# Patient Record
Sex: Male | Born: 1960 | Race: Black or African American | Hispanic: No | Marital: Married | Smoking: Former smoker
Health system: Southern US, Academic
[De-identification: ages and names within clinical notes are randomized; demographics above are authoritative.]

## PROBLEM LIST (undated history)

## (undated) DIAGNOSIS — E119 Type 2 diabetes mellitus without complications: Secondary | ICD-10-CM

## (undated) DIAGNOSIS — Z86010 Personal history of colonic polyps: Secondary | ICD-10-CM

## (undated) DIAGNOSIS — S83207A Unspecified tear of unspecified meniscus, current injury, left knee, initial encounter: Secondary | ICD-10-CM

## (undated) DIAGNOSIS — J449 Chronic obstructive pulmonary disease, unspecified: Secondary | ICD-10-CM

## (undated) DIAGNOSIS — Z9989 Dependence on other enabling machines and devices: Secondary | ICD-10-CM

## (undated) DIAGNOSIS — Z973 Presence of spectacles and contact lenses: Secondary | ICD-10-CM

## (undated) DIAGNOSIS — I10 Essential (primary) hypertension: Secondary | ICD-10-CM

## (undated) DIAGNOSIS — G4730 Sleep apnea, unspecified: Secondary | ICD-10-CM

## (undated) HISTORY — PX: SHOULDER SURGERY: SHX246

## (undated) HISTORY — DX: Type 2 diabetes mellitus without complications (CMS HCC): E11.9

## (undated) HISTORY — PX: HX SHOULDER ARTHROSCOPY: SHX128

## (undated) HISTORY — PX: CATARACT EXTRACTION W/ INTRAOCULAR LENS IMPLANT: SHX1309

## (undated) HISTORY — DX: Sleep apnea, unspecified: G47.30

## (undated) HISTORY — PX: HX CARPAL TUNNEL RELEASE: SHX101

## (undated) HISTORY — PX: COLONOSCOPY: SHX174

## (undated) HISTORY — DX: Essential (primary) hypertension: I10

## (undated) HISTORY — PX: HX HEART CATHETERIZATION: SHX148

---

## 2016-06-11 ENCOUNTER — Ambulatory Visit (HOSPITAL_COMMUNITY): Payer: Self-pay

## 2020-09-04 DIAGNOSIS — I252 Old myocardial infarction: Secondary | ICD-10-CM | POA: Insufficient documentation

## 2020-09-04 DIAGNOSIS — J4489 Other specified chronic obstructive pulmonary disease (CMS HCC): Secondary | ICD-10-CM | POA: Insufficient documentation

## 2020-09-04 DIAGNOSIS — Z720 Tobacco use: Secondary | ICD-10-CM | POA: Insufficient documentation

## 2020-09-04 DIAGNOSIS — G4730 Sleep apnea, unspecified: Secondary | ICD-10-CM | POA: Insufficient documentation

## 2020-09-10 IMAGING — MR MRI ABDOMEN WITHOUT AND WITH CONTRAST
14 series · 48 of 48 positions shown · IV contrast (Gadavist)
Comparison: None available.

﻿EXAM:  MRI ABDOMEN WITHOUT AND WITH CONTRAST
INDICATION: Pancreatic lesion.
TECHNIQUE: Multiplanar multisequential MRI of the abdomen was performed without and with 7 mL of Gadavist. A non-conventional body coil was utilized due to patient's extremely large body habitus.

[Series 7: cor basg bh · coronal · 10.0mm · 1.31mm/px · 2 of 24 slices shown]
[im 1/24]
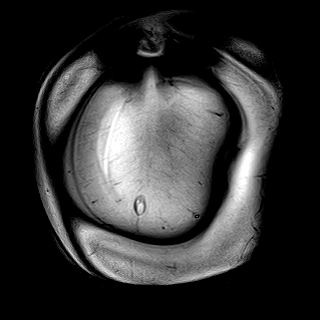
[im 24/24]
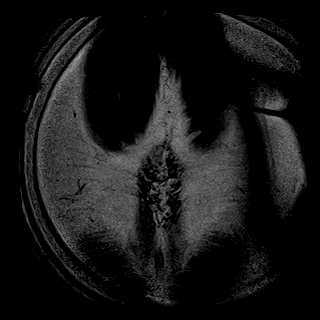

[Series 8: axial in/out phase · axial · 12.0mm · 1.76mm/px · z∈[-117,+182]mm · 2 of 24 slices shown (1 of 2)]
[im 1/24]
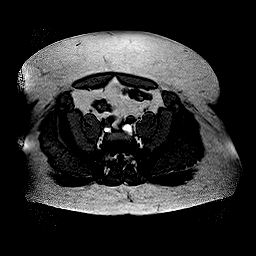
[im 24/24]
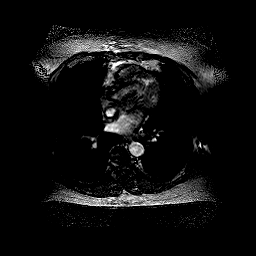

[Series 9: axial in/out phase · axial · 12.0mm · 1.76mm/px · z∈[-117,+182]mm · 2 of 24 slices shown (2 of 2)]
[im 1/24]
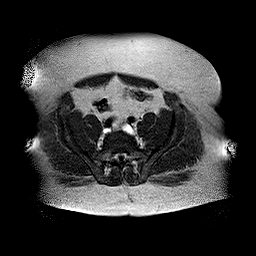
[im 24/24]
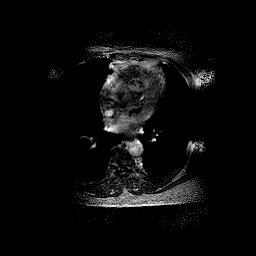

[Series 10: T2 · axial · 12.0mm · 1.76mm/px · z∈[-117,+182]mm · 2 of 24 slices shown (1 of 3)]
[im 1/24]
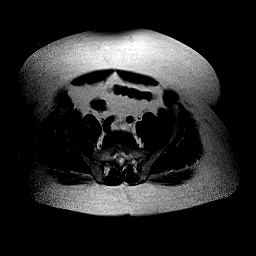
[im 24/24]
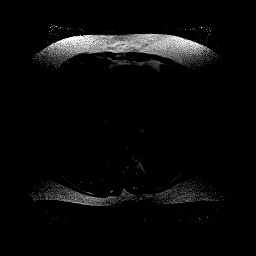

[Series 11: T2 · axial · 12.0mm · 1.76mm/px · z∈[-117,+182]mm · 3 of 24 slices shown (2 of 3)]
[im 1/24]
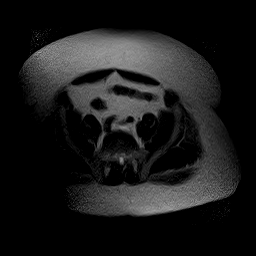
[im 12/24]
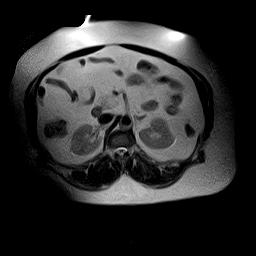
[im 24/24]
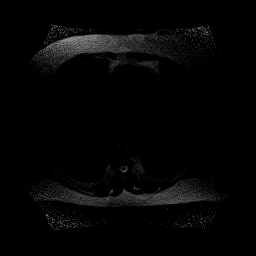

[Series 13: T1 · axial · 12.0mm · 1.76mm/px · z∈[-117,+182]mm · 3 of 24 slices shown (1 of 2)]
[im 1/24]
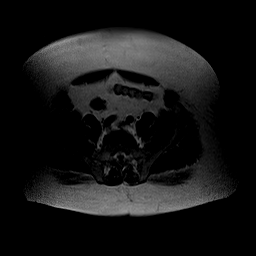
[im 12/24]
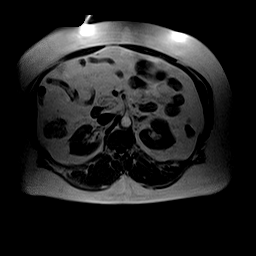
[im 24/24]
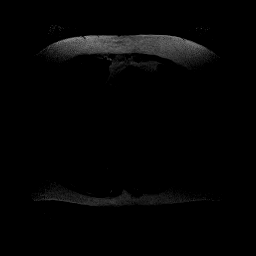

[Series 14: axial basg bh · axial · 12.0mm · 1.41mm/px · z∈[-117,+182]mm · 3 of 24 slices shown]
[im 1/24]
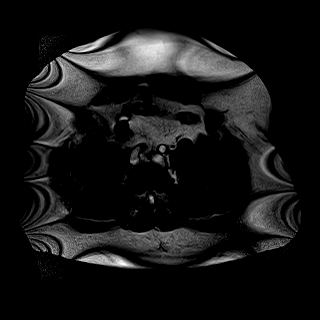
[im 12/24]
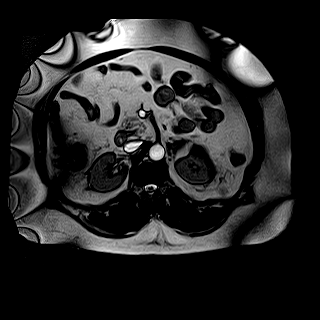
[im 24/24]
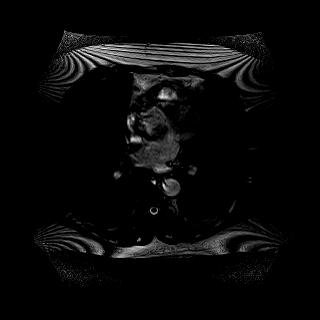

[Series 15: T1 · coronal · 10.0mm · 1.64mm/px · 3 of 24 slices shown (2 of 2)]
[im 1/24]
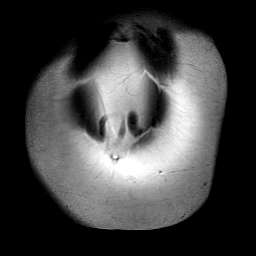
[im 12/24]
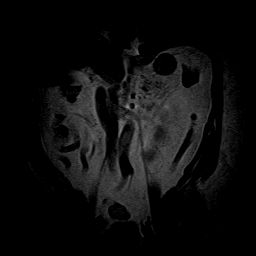
[im 24/24]
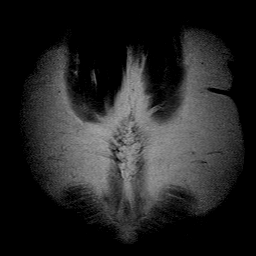

[Series 16: T2 · coronal · 10.0mm · 1.64mm/px · 3 of 24 slices shown (3 of 3)]
[im 1/24]
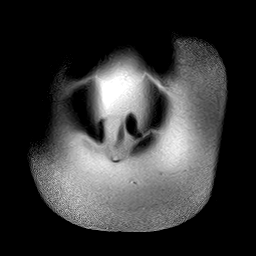
[im 12/24]
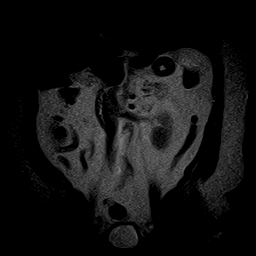
[im 24/24]
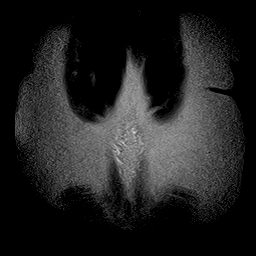

[Series 17: (person_name) pre bh · coronal · non-contrast · 10.0mm · 1.41mm/px · 6 of 52 slices shown]
[im 1/52]
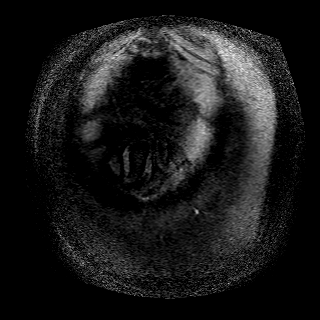
[im 11/52]
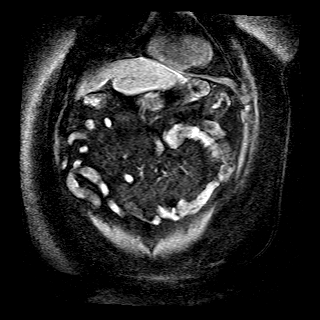
[im 21/52]
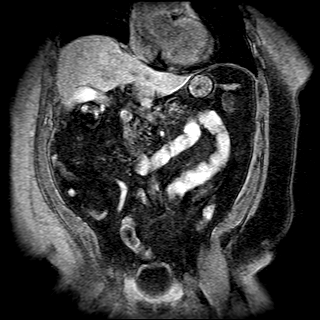
[im 31/52]
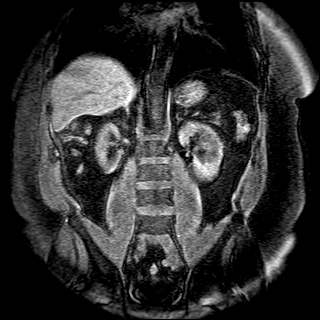
[im 41/52]
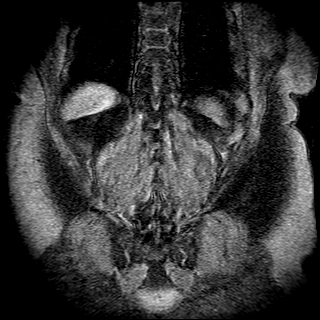
[im 52/52]
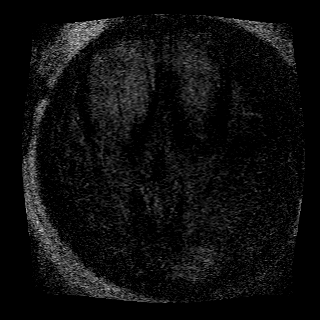

[Series 18: T2 fat-sat · axial · 12.0mm · 2.01mm/px · z∈[-117,+182]mm · 3 of 24 slices shown]
[im 1/24]
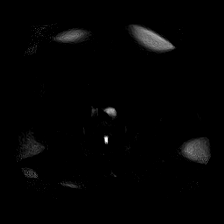
[im 12/24]
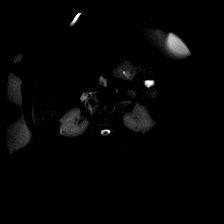
[im 24/24]
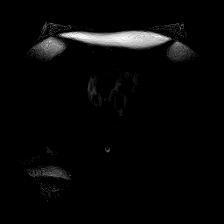

[Series 19: axial (person_name) pre · axial · non-contrast · 10.0mm · 1.41mm/px · z∈[-43,+182]mm · 5 of 46 slices shown]
[im 1/46]
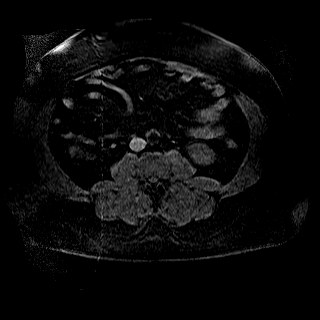
[im 12/46]
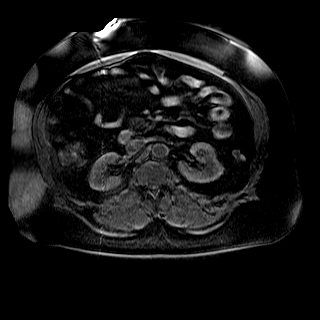
[im 23/46]
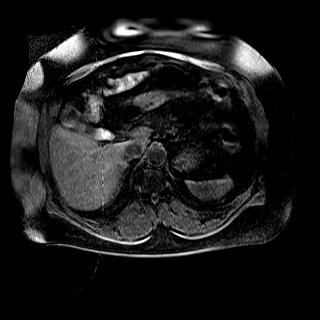
[im 34/46]
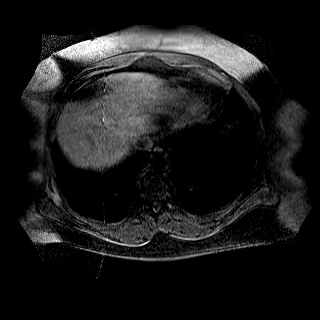
[im 46/46]
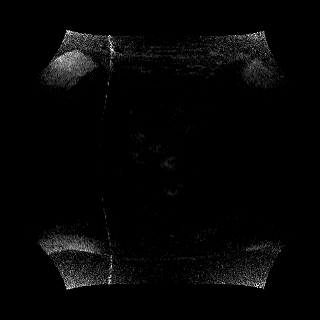

[Series 20: axial (person_name) · axial · 10.0mm · 1.41mm/px · z∈[-43,+182]mm · 5 of 46 slices shown]
[im 1/46]
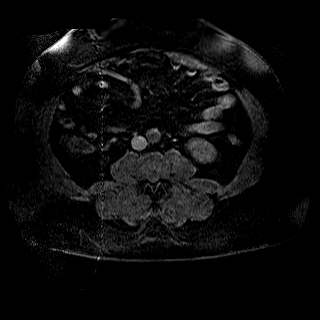
[im 12/46]
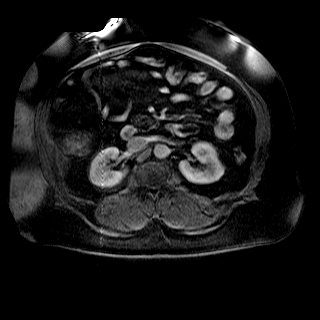
[im 23/46]
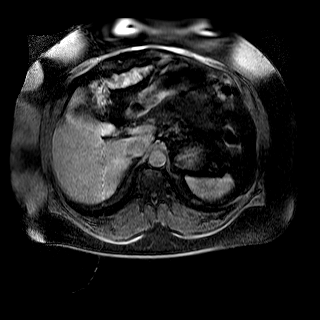
[im 34/46]
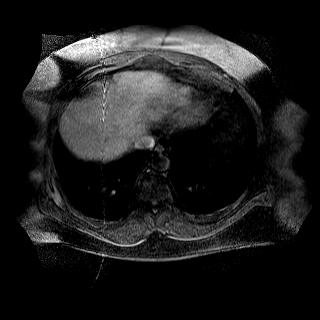
[im 46/46]
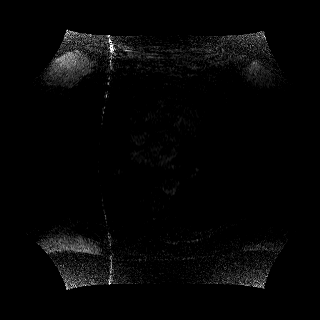

[Series 21: (person_name) · coronal · 10.0mm · 1.41mm/px · 6 of 52 slices shown]
[im 1/52]
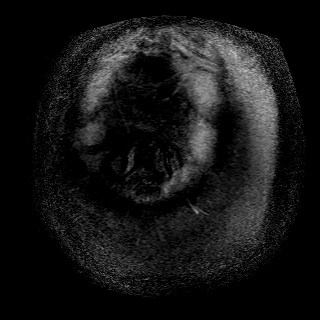
[im 11/52]
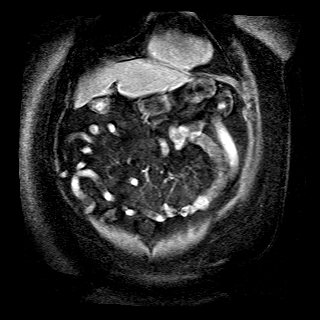
[im 21/52]
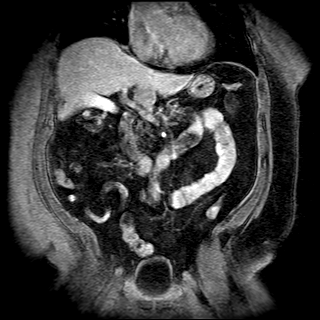
[im 31/52]
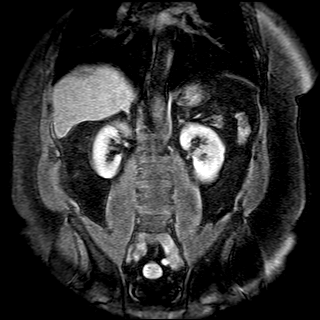
[im 41/52]
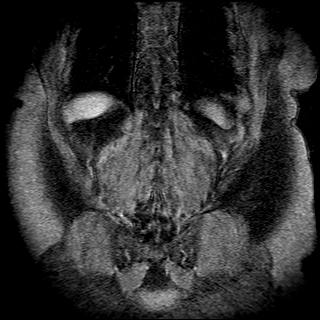
[im 52/52]
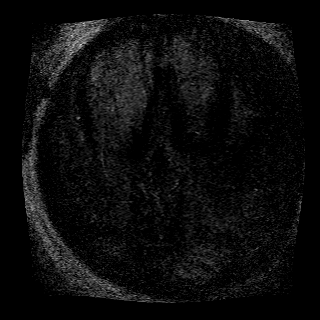

[48 of 48 positions shown; findings below may reference images not displayed]

FINDINGS: There is fatty infiltration of the liver. There is a 10 mm right kidney upper pole cyst. Gallbladder, spleen, pancreas, adrenal glands and left kidney are normal. No suspicious pancreatic mass is identified. There is no abnormal enhancement. No pleural effusion or upper abdominal ascites is seen. There is no definite retroperitoneal adenopathy.
IMPRESSION: 1. No suspicious pancreatic mass. 

2. Fatty liver.

## 2020-10-02 DIAGNOSIS — R9439 Abnormal result of other cardiovascular function study: Secondary | ICD-10-CM | POA: Insufficient documentation

## 2021-01-11 IMAGING — MR MRI JOINT UPPER EXTREMITY WITHOUT CONTRAST LT
7 series · 38 of 40 positions shown · non-contrast
Comparison: No prior radiographic studies of the shoulder available for comparison.

﻿EXAM:  MRI JOINT UPPER EXTREMITY WITHOUT CONTRAST LT SHOULDER
INDICATION: 59-year-old with persistent left shoulder pain for several months with limited range of motion.  No history of shoulder surgery.
TECHNIQUE: Axial, oblique coronal and oblique sagittal images of the left shoulder as per protocol.

[Series 7: T1 · oblique · left · 4.0mm · 0.33mm/px · 5 of 20 slices shown]
[im 1/20]
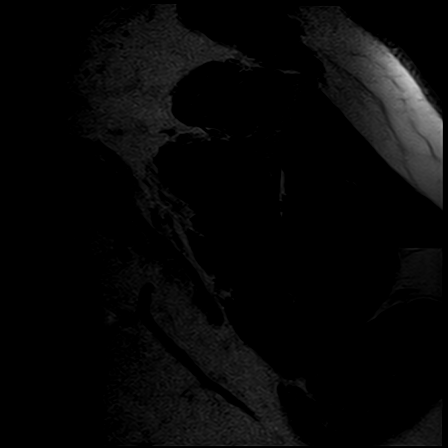
[im 5/20]
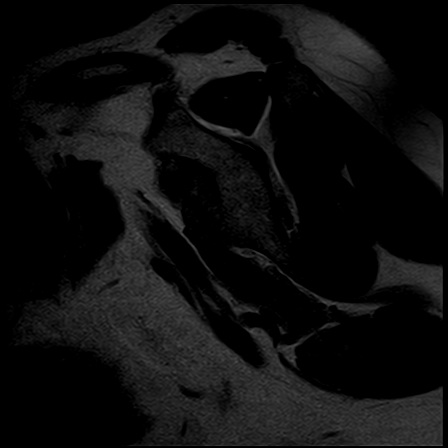
[im 10/20]
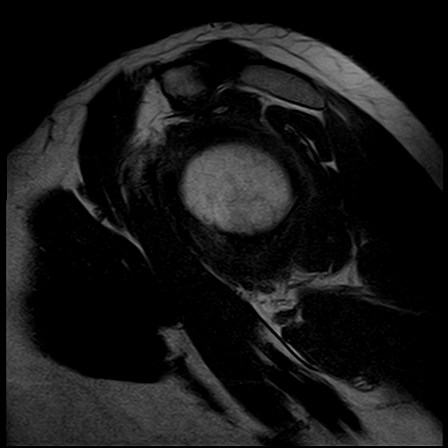
[im 15/20]
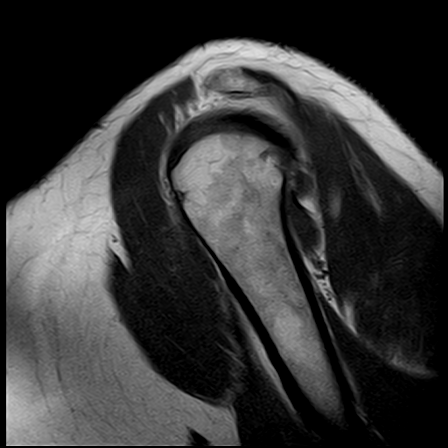
[im 20/20]
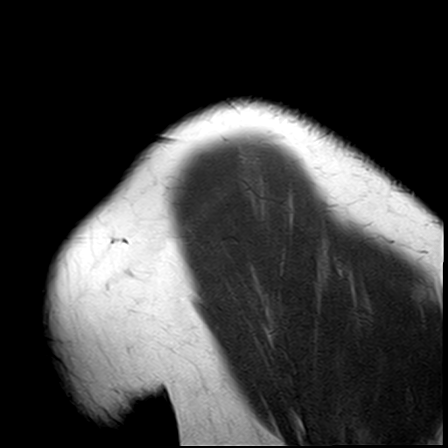

[Series 8: PD fat-sat · axial · left · 4.0mm · 0.50mm/px · z∈[-40,+51]mm · 5 of 22 slices shown (1 of 2)]
[im 1/22]
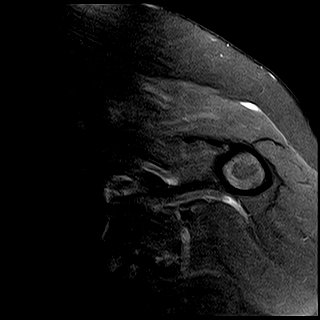
[im 6/22]
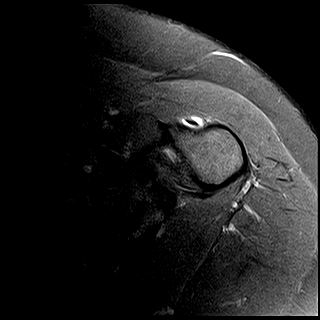
[im 11/22]
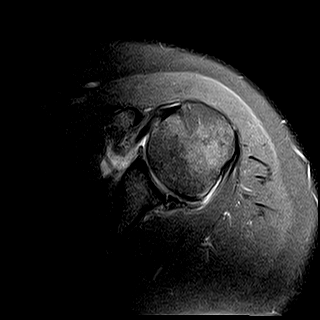
[im 16/22]
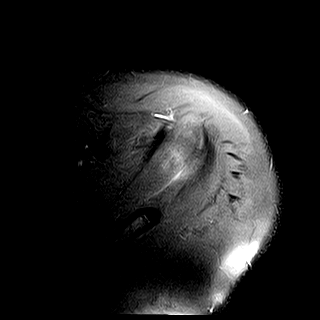
[im 22/22]
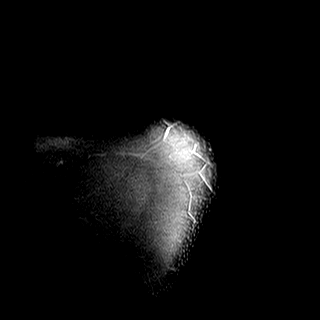

[Series 9: T2 · oblique · left · 4.0mm · 0.39mm/px · 6 of 20 slices shown]
[im 1/20]
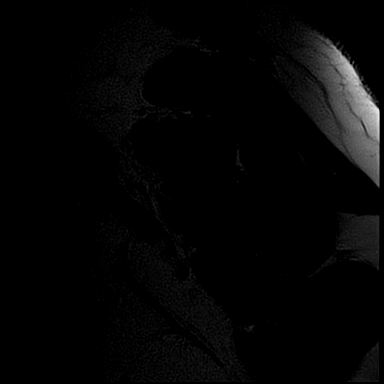
[im 4/20]
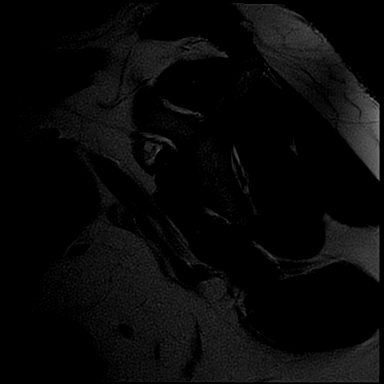
[im 8/20]
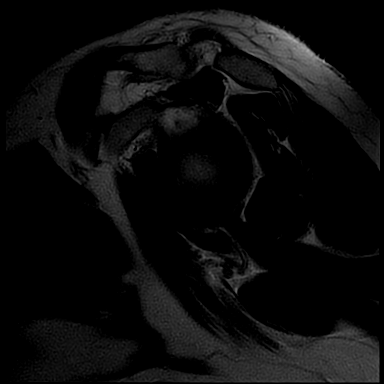
[im 12/20]
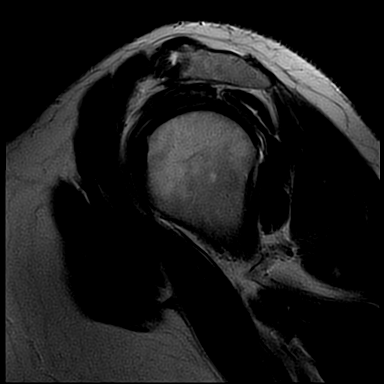
[im 16/20]
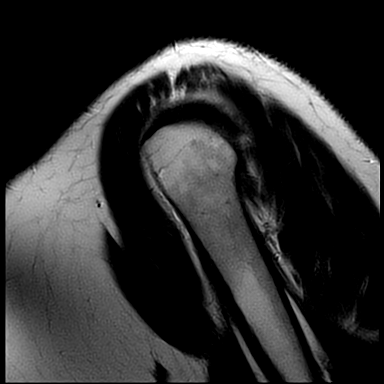
[im 20/20]
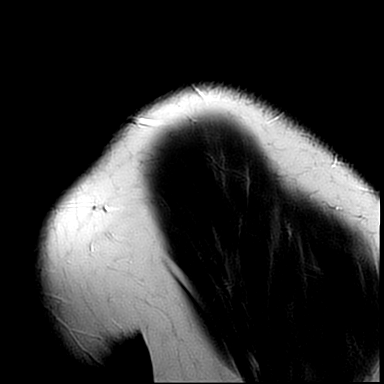

[Series 10: PD fat-sat · oblique · left · 4.0mm · 0.47mm/px · 6 of 20 slices shown (2 of 2)]
[im 1/20]
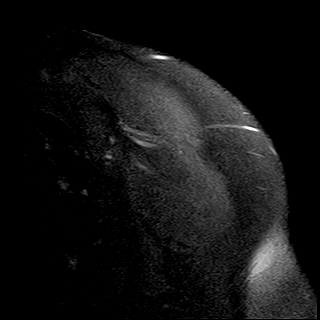
[im 4/20]
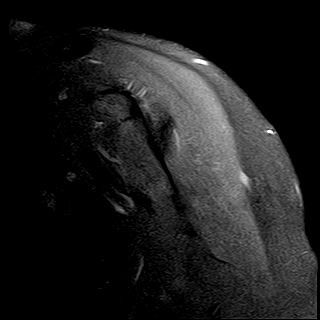
[im 8/20]
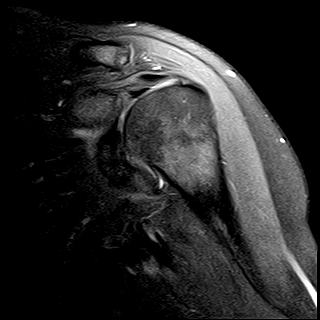
[im 12/20]
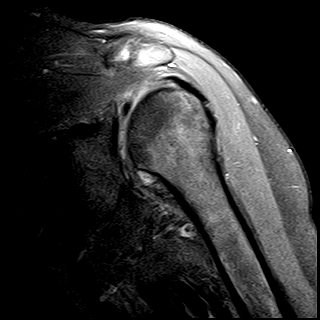
[im 16/20]
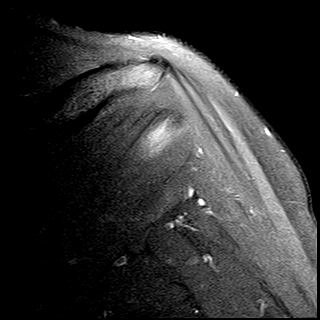
[im 20/20]
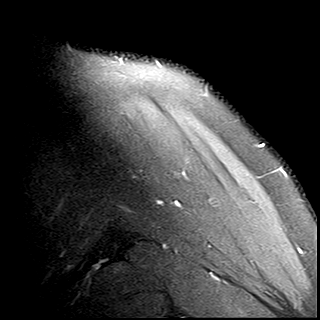

[Series 11: STIR · oblique · left · 4.0mm · 0.47mm/px · 6 of 20 slices shown (1 of 2)]
[im 1/20]
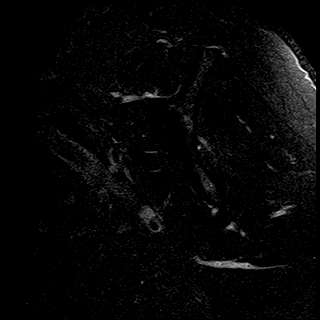
[im 4/20]
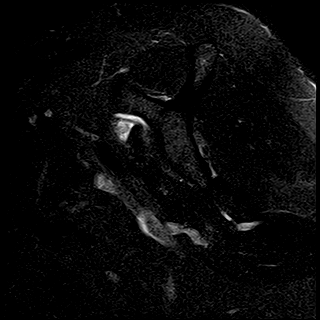
[im 8/20]
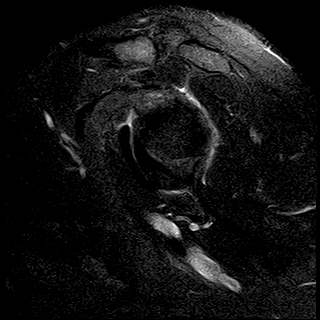
[im 12/20]
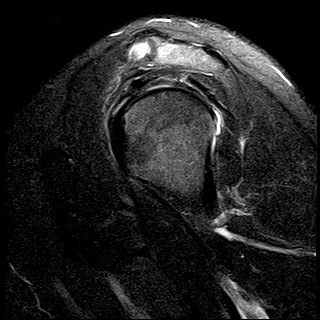
[im 16/20]
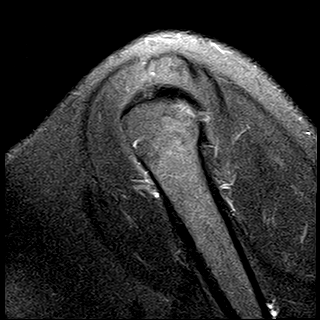
[im 20/20]
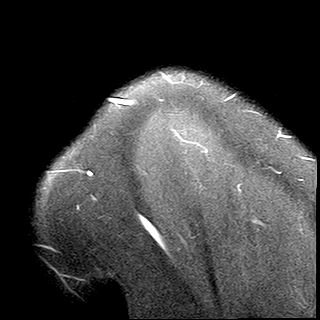

[Series 12: T2 fat-sat · axial · left · 4.0mm · 0.42mm/px · z∈[-40,+51]mm · 6 of 22 slices shown]
[im 1/22]
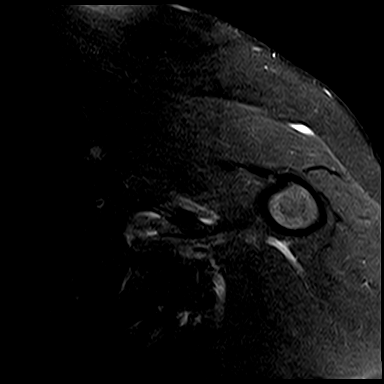
[im 5/22]
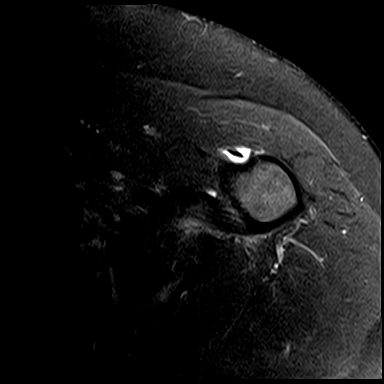
[im 9/22]
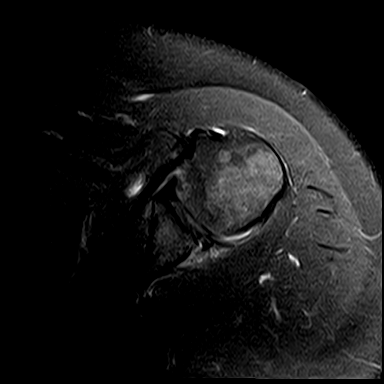
[im 13/22]
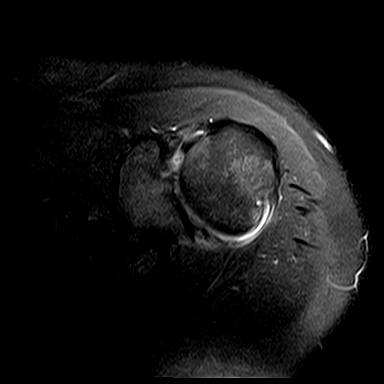
[im 17/22]
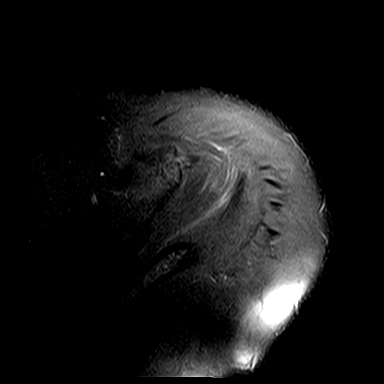
[im 22/22]
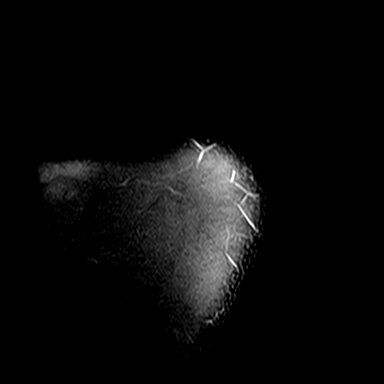

[Series 13: STIR · oblique · left · 4.0mm · 0.47mm/px · 4 of 20 slices shown (2 of 2)]
[im 1/20]
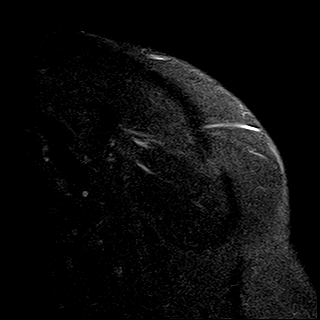
[im 4/20]
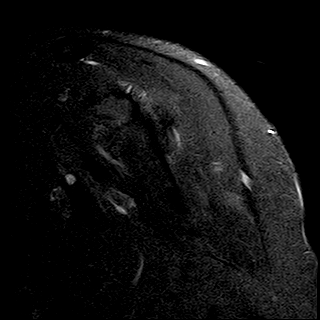
[im 8/20]
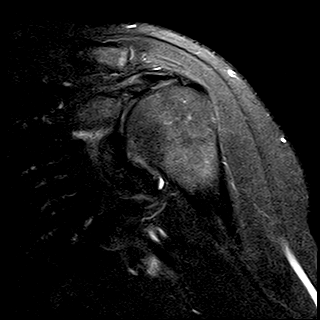
[im 12/20]
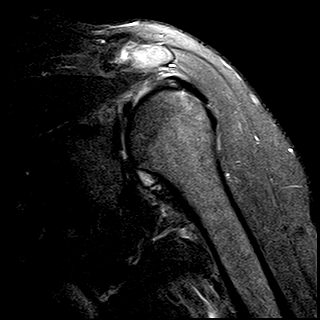

[38 of 40 positions shown; findings below may reference images not displayed]

FINDINGS: No acute bony lesions at the left shoulder.  Significant degenerative changes of AC joint are mildly impinging on subacromion space and the supraspinatus.  Mild supraspinatus tendinitis with small intrasubstance tear within the distal supraspinatus and a tiny partial thickness tear on the bursal side of the supraspinatus in the subacromion space.  No full thickness tear of rotator cuff is seen.  

Mild degenerative changes of the glenohumeral joint and the glenoid labrum are noted.  Small cystic degeneration and nondisplaced tear of the posterior lip of the glenoid labrum is noted.  Small effusion in the subacromion bursa is noted.  Soft tissues of the axilla are normal.
IMPRESSION: 1. No acute bony lesions at the left shoulder.  

2. Degenerative changes of AC joint impinging on the subacromion space and supraspinatus.  Supraspinatus tendinitis and small partial thickness tear as mentioned above of the supraspinatus.  No full thickness tear.  

3. Moderate degenerative changes of glenohumeral joint and glenoid labrum. Nondisplaced tear of the posterior lip of the glenoid labrum with cystic change.

## 2021-09-05 DIAGNOSIS — I1310 Hypertensive heart and chronic kidney disease without heart failure, with stage 1 through stage 4 chronic kidney disease, or unspecified chronic kidney disease: Secondary | ICD-10-CM | POA: Insufficient documentation

## 2021-09-05 DIAGNOSIS — N182 Chronic kidney disease, stage 2 (mild): Secondary | ICD-10-CM | POA: Insufficient documentation

## 2022-10-14 ENCOUNTER — Telehealth (INDEPENDENT_AMBULATORY_CARE_PROVIDER_SITE_OTHER): Payer: Self-pay | Admitting: PULMONARY DISEASE

## 2022-10-14 MED ORDER — PROMETHAZINE 6.25 MG/5 ML ORAL SYRUP
6.2500 mg | ORAL_SOLUTION | Freq: Four times a day (QID) | ORAL | 0 refills | Status: DC | PRN
Start: 2022-10-14 — End: 2023-08-06

## 2022-10-21 ENCOUNTER — Encounter (INDEPENDENT_AMBULATORY_CARE_PROVIDER_SITE_OTHER): Payer: Self-pay | Admitting: PULMONARY DISEASE

## 2022-10-22 NOTE — Telephone Encounter (Signed)
Qlabs (-) - pt aware

## 2022-12-04 ENCOUNTER — Encounter (INDEPENDENT_AMBULATORY_CARE_PROVIDER_SITE_OTHER): Payer: Self-pay | Admitting: PULMONARY DISEASE

## 2022-12-04 ENCOUNTER — Ambulatory Visit: Payer: BC Managed Care – PPO | Attending: PULMONARY DISEASE | Admitting: PULMONARY DISEASE

## 2022-12-04 ENCOUNTER — Other Ambulatory Visit: Payer: Self-pay

## 2022-12-04 VITALS — BP 107/79 | HR 93 | Temp 97.4°F | Resp 20 | Ht 67.0 in | Wt 243.0 lb

## 2022-12-04 DIAGNOSIS — J309 Allergic rhinitis, unspecified: Secondary | ICD-10-CM | POA: Insufficient documentation

## 2022-12-04 DIAGNOSIS — F17200 Nicotine dependence, unspecified, uncomplicated: Secondary | ICD-10-CM | POA: Insufficient documentation

## 2022-12-04 DIAGNOSIS — R053 Chronic cough: Secondary | ICD-10-CM | POA: Insufficient documentation

## 2022-12-04 DIAGNOSIS — I10 Essential (primary) hypertension: Secondary | ICD-10-CM | POA: Insufficient documentation

## 2022-12-04 NOTE — Nursing Note (Signed)
Epworth Scale Score:  Score total: 4    Are you currently on CPAP/BIPAP/TRILOGY CPAP  DME Company Information: [employer]    Has previous OSA symptoms resolved with CPAP/BIPAP/TRILOGY?Yes  Download available? No, Reason: PT FORGOT  Currently experiencing any additional sleep related issues?No  Oxygen therapy?  No  DME Company Information: N/A    What Liter Flow is the patient on? N/A    Have you received a flu shot? Yes  Have you received a pneumonia shot? Yes  Smoking History:    Social History     Tobacco Use   Smoking Status Every Ebling    Packs/Buenger: 1.00    Years: 28.00    Additional pack years: 0.00    Total pack years: 28.00    Types: Cigarettes    Start date: 1995   Smokeless Tobacco Never     Have you had any recent hospitalizations?  No  Has the patient had any tests performed since the last visit? NONE    If so, where were the test(s) performed? N/A    Does the patient have any other associated symptoms or modifying factors? None

## 2022-12-04 NOTE — Progress Notes (Signed)
[facility]  [address]  [place] [address]       Follow up/Progress Note    Patient Name: [name]  Date: 12/04/2022  Department[facility]  [facility] [place] [place]  MRN: [id number]  DOB: Jan 14, 1961  Primary Care Provider:  Welton Flakes, DO  Referring Provider:  No ref. provider found      Chief Complaint:   Chief Complaint   Patient presents with    Sleep Apnea     Nursing Notes:   Vickey Huger, [place]  12/04/22 1118  Signed  Epworth Scale Score:  Score total: 4    Are you currently on CPAP/BIPAP/TRILOGY CPAP  DME Company Information: [employer]    Has previous OSA symptoms resolved with CPAP/BIPAP/TRILOGY?Yes  Download available? No, Reason: PT FORGOT  Currently experiencing any additional sleep related issues?No  Oxygen therapy?  No  DME Company Information: N/A    What Liter Flow is the patient on? N/A    Have you received a flu shot? Yes  Have you received a pneumonia shot? Yes  Smoking History:    Social History     Tobacco Use   Smoking Status Every [place]    Packs/G[place]ney: 1.00    Years: 28.00    Additional pack years: 0.00    Total pack years: 28.00    Types: Cigarettes    Start date: 1995   Smokeless Tobacco Never     Have you had any recent hospitalizations?  No  Has the patient had any tests performed since the last visit? NONE    If so, where were the test(s) performed? N/A    Does the patient have any other associated symptoms or modifying factors? None      HPI:  Patient was here in February.  Was doing well at that time.  He would quit smoking.  Was only using p.r.n. ProAir inhaler.  We made him a p.r.n. follow up.  However in October of this year he developed cough congestion shortness of breath.  Had the symptoms for about 8 weeks.  Was treated with multiple rounds of antibiotics steroids and antihistamines.  We send him for acute lab studies which was completely negative.  His cough now has subsided and feels much better.  He is here  today to discuss continued low-dose screening CT scans.  He thought his primary care physician was ordering these but he tried to get this scheduled and was unable to.  He wishes to follow with [place] yearly for low-dose screening CT scans to make sure a "we are looking at his lungs"  He denies any chest pain at this time.  He did have a fall this morning while he was taking out the trash.  Busted his left knee.  [facility] plans [facility] to have this evaluated.  Patient reports he started smoking again.  Smoking less than a half pack per Duve.  Has smoked now for over 30 years.  He quit for about 6 months but started back a few weeks ago.        Past Medical History:  Past Medical History:   Diagnosis Date    Diabetes mellitus (CMS HCC)     Essential hypertension     Sleep apnea      Past Surgical History  Past Surgical History:   Procedure Laterality Date    SHOULDER SURGERY       Medication List  Current Outpatient Medications  Medication Sig    aspirin (ECOTRIN) 81 mg Oral Tablet, Delayed Release (E.C.) Take 1 tablet every Bednarz by oral route.    atorvastatin (LIPITOR) 40 mg Oral Tablet Take 1 Tablet (40 mg total) by mouth Once a Shorkey    hydroCHLOROthiazide (HYDRODIURIL) 12.5 mg Oral Tablet TAKE 1 TABLET BY ORAL ROUTE M,W,FRI    metFORMIN (GLUCOPHAGE) 500 mg Oral Tablet Take 1 Tablet (500 mg total) by mouth    montelukast (SINGULAIR) 10 mg Oral Tablet     olmesartan (BENICAR) 20 mg Oral Tablet Take 1 Tablet (20 mg total) by mouth Once a Amrein as directed    promethazine (PHENERGAN) 6.25 mg/5 mL Oral Syrup Take 5 mL (6.25 mg total) by mouth Every 6 hours as needed (cough) (Patient not taking: Reported on 12/04/2022)    VENTOLIN HFA 90 mcg/actuation Inhalation oral inhaler Take 1-2 Puffs by inhalation Every 6 hours as needed     Allergy List  Allergy History as of 12/04/22       AMOXICILLIN-POT CLAVULANATE         Noted Status Severity Type Reaction    12/04/22 [address], [place], [place] 09/05/20 Active    Other Adverse  Reaction (Add comment)              VANCOMYCIN         Noted Status Severity Type Reaction    12/04/22 [address], [place], [place] 05/30/20 Active    Other Adverse Reaction (Add comment)                  Family History   Family Medical History:       Problem Relation (Age of Onset)    No Known Problems Mother, Father            Social History  Social History     Socioeconomic History    Marital status: Married   Tobacco Use    Smoking status: Every Smelser     Packs/Titsworth: 1.00     Years: 28.00     Additional pack years: 0.00     Total pack years: 28.00     Types: Cigarettes     Start date: 1995    Smokeless tobacco: Never   Vaping Use    Vaping Use: Never used        Review of system  General:  Denies fever, chills, night sweats, fatigue, loss of appetite.  Neurological:  Denies headache, snoring.   Gastrointestinal:  Denies reflux, heartburn, diarrhea.  Cardiovascular:  Denies chest pain, irregular heartbeats.  Musculoskeletal:  Denies joint pain, restless legs.  Endocrine/Metabolic:  Denies weight gain, weight loss.  Immunologic:  Denies sinus allergy symptoms.  Mental Status/Psychiatric:  Denies anxiety, depression.  ENT:  Denies nasal congestion, hoarseness, postnasal drip.  Integumentary:  Denies rash, new skin lesions.    Objective:  Vital Signs  Vitals:    12/04/22 1103   BP: 107/79   Pulse: 93   Resp: 20   Temp: 36.3 C (97.4 F)   SpO2: 97%   Weight: 110 kg (243 lb)   Height: 1.702 m (5\' 7" )   BMI: 38.14         PHYSICAL EXAMINATION:   Constitutional:  Vital signs stable.  General appearance of the patient:  Alert, no acute distress.  Normal appearance, well nourished.  Eyes: PERRLA and normal eye lids.  Conjunctiva normal.  Ears, Nose, Mouth, and Throat: External inspection of ears and nose with normal appearance.  Inspection  of lips, teeth and gums with normal appearance and oral mucosa normal.  Neck: Supple with trachea midline, non tender, no nodules, no masses, gland position midline.  Respiratory:   Auscultation of lungs with normal breath sounds, no rales, no rhonchi, no wheezing.  Respiratory effort with no tractions, breathing regular and unlabored.  Cardiovascular:  Regular rhythm and regular rate.  No murmur, no peripheral edema.  Gastrointestinal: Abdomen non-tender, no masses, no hepatomegaly present.  Lymphatic:  No lymphadenopathy present, no supraclavicular nodes.  Musculoskeletal:  Normal gait and station, normal digits, no digital cyanosis or clubbing.  Mental Status/Psychiatric:  Alert, grossly oriented to person, place, and time.  Appropriate and normal mood.    Imaging  No image results found.      Assessment    ICD-10-CM    1. Nicotine dependence  F17.200 CT LUNG SCREENING LDCT      2. Essential hypertension  I10 CT LUNG SCREENING LDCT      3. Chronic cough  R05.3 CT LUNG SCREENING LDCT      4. Allergic rhinitis  J30.9 CT LUNG SCREENING LDCT            Plan  Continue medications as prescribed/directed unless changed by provider.  Plan of care discussed with patient.    Return in about 56 weeks (around 12/31/2023).  We will obtain low-dose screening CT scan of the chest.  Risks, benefits, indications of screening CT scans were discussed with the patient in detail.  He verbalized understanding and wishes to proceed.  Wants to have this done at [facility].  Continue p.r.n. inhaler.  Strongly encouraged complete tobacco cessation  The patient was given the opportunity to ask questions and those questions were answered to the patient's satisfaction. The patient was encouraged to call with any additional questions or concerns. Discussed with the patient effects and side effects of medications. Medication safety was discussed.  The patient was informed to contact the office within 7 business days if a message/lab results/referral/imaging results have not been conveyed to the patient.    Electronically signed by Duard Brady, DO      This note may have been partially generated using  MModal Fluency Direct system, and there may be some incorrect words, spellings, and punctuation that were not noted in checking the note before saving.

## 2023-01-13 ENCOUNTER — Encounter (INDEPENDENT_AMBULATORY_CARE_PROVIDER_SITE_OTHER): Payer: Self-pay | Admitting: PULMONARY DISEASE

## 2023-02-25 ENCOUNTER — Telehealth (INDEPENDENT_AMBULATORY_CARE_PROVIDER_SITE_OTHER): Payer: Self-pay | Admitting: PULMONARY DISEASE

## 2023-02-25 NOTE — Telephone Encounter (Signed)
Wife called and LM on acute about pt having a virus - failing meds.  I called back - rings fast busy.  (Bad storms today)

## 2023-04-01 ENCOUNTER — Encounter (HOSPITAL_BASED_OUTPATIENT_CLINIC_OR_DEPARTMENT_OTHER): Payer: Self-pay | Admitting: ORTHOPAEDIC SURGERY

## 2023-04-01 DIAGNOSIS — M1710 Unilateral primary osteoarthritis, unspecified knee: Secondary | ICD-10-CM

## 2023-04-01 DIAGNOSIS — E119 Type 2 diabetes mellitus without complications: Secondary | ICD-10-CM

## 2023-04-01 DIAGNOSIS — S83207A Unspecified tear of unspecified meniscus, current injury, left knee, initial encounter: Secondary | ICD-10-CM

## 2023-04-03 ENCOUNTER — Encounter (HOSPITAL_BASED_OUTPATIENT_CLINIC_OR_DEPARTMENT_OTHER): Payer: Self-pay | Admitting: ORTHOPAEDIC SURGERY

## 2023-04-03 DIAGNOSIS — E119 Type 2 diabetes mellitus without complications: Secondary | ICD-10-CM | POA: Insufficient documentation

## 2023-04-03 DIAGNOSIS — M1710 Unilateral primary osteoarthritis, unspecified knee: Secondary | ICD-10-CM | POA: Insufficient documentation

## 2023-04-03 DIAGNOSIS — S83207A Unspecified tear of unspecified meniscus, current injury, left knee, initial encounter: Secondary | ICD-10-CM | POA: Insufficient documentation

## 2023-05-04 DIAGNOSIS — I420 Dilated cardiomyopathy: Secondary | ICD-10-CM | POA: Insufficient documentation

## 2023-05-06 ENCOUNTER — Encounter (INDEPENDENT_AMBULATORY_CARE_PROVIDER_SITE_OTHER): Payer: Self-pay | Admitting: CARDIOVASCULAR DISEASE

## 2023-05-15 ENCOUNTER — Other Ambulatory Visit: Payer: Self-pay

## 2023-05-15 ENCOUNTER — Ambulatory Visit (HOSPITAL_BASED_OUTPATIENT_CLINIC_OR_DEPARTMENT_OTHER): Payer: BC Managed Care – PPO | Admitting: ORTHOPAEDIC SURGERY

## 2023-05-15 ENCOUNTER — Encounter (HOSPITAL_BASED_OUTPATIENT_CLINIC_OR_DEPARTMENT_OTHER): Payer: Self-pay | Admitting: ORTHOPAEDIC SURGERY

## 2023-05-15 VITALS — Ht 67.0 in | Wt 244.0 lb

## 2023-05-15 DIAGNOSIS — M1710 Unilateral primary osteoarthritis, unspecified knee: Secondary | ICD-10-CM

## 2023-05-15 DIAGNOSIS — M1712 Unilateral primary osteoarthritis, left knee: Secondary | ICD-10-CM

## 2023-05-15 DIAGNOSIS — S83204D Other tear of unspecified meniscus, current injury, left knee, subsequent encounter: Secondary | ICD-10-CM

## 2023-05-15 NOTE — Progress Notes (Unsigned)
[address]  [place] [address]     New Patient Note    Name: [name] MRN:  [id number]   Date: 05/15/2023 DOB:  1961/02/25 (62 y.o.)         Chief Complaint:   Chief Complaint   Patient presents with    Knee Pain     F/u left knee meniscus tear.  Had injection LOV only helped 2 weeks.         Subjective     [name] is a 62 y.o. male presenting with patient has follow-up meniscus tear with recurrent effusion.  He says that the injection we placed back in marginally helped him for about 2 weeks his pain is getting worse.  He is tried the exercises we requested which was basically stretching exercises and he can not do that because he can not bend his knee.  He presents today with a a limp and difficulty with getting up out of a chair.    I discussed with him the findings of the previous x-rays showed that he had some predominant arthritis mainly in the patellofemoral joint but good weight-bearing surfaces with no evidence of bone-on-bone changes.  The    Medical History:  I have reviewed and updated as appropriate the past medical, family and social history today:  Past Medical History:   Diagnosis Date    Diabetes mellitus (CMS HCC)     Essential hypertension     Sleep apnea      Past Surgical History:   Procedure Laterality Date    HX CARPAL TUNNEL RELEASE      HX SHOULDER ARTHROSCOPY Left     SHOULDER SURGERY       Family Medical History:       Problem Relation (Age of Onset)    No Known Problems Mother, Father            Allergies:  Allergies   Allergen Reactions    Amoxicillin-Pot Clavulanate  Other Adverse Reaction (Add comment)    Vancomycin  Other Adverse Reaction (Add comment)       Medications:  Current Outpatient Medications   Medication Sig    aspirin (ECOTRIN) 81 mg Oral Tablet, Delayed Release (E.C.) Take 1 tablet every Mcquigg by oral route.    atorvastatin (LIPITOR) 40 mg Oral Tablet Take 1 Tablet (40 mg total) by mouth Once a Gagen     hydroCHLOROthiazide (HYDRODIURIL) 12.5 mg Oral Tablet TAKE 1 TABLET BY ORAL ROUTE M,W,FRI    metFORMIN (GLUCOPHAGE) 500 mg Oral Tablet Take 1 Tablet (500 mg total) by mouth    montelukast (SINGULAIR) 10 mg Oral Tablet     olmesartan (BENICAR) 20 mg Oral Tablet Take 1 Tablet (20 mg total) by mouth Once a Foronda as directed    promethazine (PHENERGAN) 6.25 mg/5 mL Oral Syrup Take 5 mL (6.25 mg total) by mouth Every 6 hours as needed (cough) (Patient not taking: Reported on 12/04/2022)    VENTOLIN HFA 90 mcg/actuation Inhalation oral inhaler Take 1-2 Puffs by inhalation Every 6 hours as needed       Problem List:  Patient Active Problem List    Diagnosis    Osteoarthritis of patellofemoral joint    Tear of meniscus of left knee    Type 2 diabetes mellitus (CMS HCC)    Nicotine dependence    Essential hypertension    Chronic cough    Allergic rhinitis  Review of Systems:  Constitutional: The patient denied pain, obesity, recent illness, chills, diaphoresis, fatigue, fever and malaise.  Cardiovascular: The patient denied arrhythmia, chest pain/pressure, dyspnea and edema.  Respiratory: The patient denied asthma, chest congestion, chest tightness, shortness of breath and cough.  Gastrointestinal: The patient denied gastroesophageal reflux, rectal bleeding, rectal pain, weight loss, perianal pain, abdominal pain, perirectal issues, anorexia, constipation, diarrhea, gas and bloating, nausea and vomiting.  Genitourinary/Nephrology: The patient denied burning urination, difficulty urinating and urinating at night.  Musculoskeletal: The patient denied fall in last 6 months, leg swelling, pain while walking and back pain.  Dermatologic: The patient denied rash.  Neurologic: The patient denied rash.  Neurologic: The denied light headedness, syncope and weakness.  Endocrine: The patient denied thyroid disorder, diabetes mellitus type 1 and diabetes mellitus type 2.  Hematologic/Lymphatic: The patient denied anemia and  DVT.      Objective     Vitals:  Ht 1.702 m (5\' 7" )   Wt 111 kg (244 lb)   BMI 38.22 kg/m       Wt Readings from Last 5 Encounters:   05/15/23 111 kg (244 lb)   12/04/22 110 kg (243 lb)       Physical Exam:  Patient's examination today shows the patient has an antalgic gait he had difficulty rising from a just secondary to pain he has a 1+ effusion to his knee that is tender he has pain with the with flexion beyond 100 secondary to do the pain he is capable of full extension.  He has  ACL PCL MCL and LCL are intact.  Hyperflexion produces pain both posteromedially posterior laterally today.      I have reviewed all available imaging and laboratory studies as appropriate.  Labs:  No visits with results within 1 Month(s) from this visit.   Latest known visit with results is:   No results found for any previous visit.       Imaging:  None    Assessment     Diagnosis:  No diagnosis found.  Encounter Medications and Orders:  No orders of the defined types were placed in this encounter.      Plan     Patient this point seems to be getting worse he is going to recurring effusion the injection we tried really did not help him anti-inflammatories not helped him.  He can not proceed with weight loss as recommendation because of the difficulties having the with the knee.  The exercise program we asked him to do including stretching exercises the have not helped him.    We are going to attempt to get an MRI scan of his knee to evaluate the meniscal structures of the knee in the extend of the osteoarthritic changes behind the patella the see him back after this is done.  No follow-ups on file.    [name], MD

## 2023-06-01 ENCOUNTER — Other Ambulatory Visit: Payer: Self-pay

## 2023-06-01 ENCOUNTER — Inpatient Hospital Stay
Admission: RE | Admit: 2023-06-01 | Discharge: 2023-06-01 | Disposition: A | Discharge status: Home / Self Care | Payer: BC Managed Care – PPO | Source: Ambulatory Visit | Attending: ORTHOPAEDIC SURGERY | Admitting: ORTHOPAEDIC SURGERY

## 2023-06-01 DIAGNOSIS — S83204D Other tear of unspecified meniscus, current injury, left knee, subsequent encounter: Secondary | ICD-10-CM | POA: Insufficient documentation

## 2023-07-03 ENCOUNTER — Ambulatory Visit: Payer: BC Managed Care – PPO | Attending: ORTHOPAEDIC SURGERY | Admitting: ORTHOPAEDIC SURGERY

## 2023-07-03 ENCOUNTER — Other Ambulatory Visit: Payer: Self-pay

## 2023-07-03 ENCOUNTER — Encounter (INDEPENDENT_AMBULATORY_CARE_PROVIDER_SITE_OTHER): Payer: Self-pay | Admitting: ORTHOPAEDIC SURGERY

## 2023-07-03 VITALS — Ht 67.0 in | Wt 238.0 lb

## 2023-07-03 DIAGNOSIS — S83207A Unspecified tear of unspecified meniscus, current injury, left knee, initial encounter: Secondary | ICD-10-CM | POA: Insufficient documentation

## 2023-07-03 NOTE — Progress Notes (Signed)
ORTHOPEDICS, [facility]  [address]  [place] [address]   Operated by [facility]  New Patient Note    Name: [name] MRN:  [id number]   Date: 07/03/2023 DOB:  19-Sep-1961 (62 y.o.)         Chief Complaint:   Chief Complaint   Patient presents with    MRI Results     MRI Lt Knee, patient c/o increased pain and swelling       Subjective     [name] is a 62 y.o. male presenting with patient has follow-up proceed medial meniscus tear.  He walks in with a marked antalgic gait.  Complains of pain and tenderness and swelling.  She has having a great deal more difficulty.  We shows his range of motion is limited secondary to pain.  He presents in follow-up his MRI scan was ordered    Medical History:  I have reviewed and updated as appropriate the past medical, family and social history today:  Past Medical History:   Diagnosis Date    Diabetes mellitus (CMS HCC)     Essential hypertension     Sleep apnea      Past Surgical History:   Procedure Laterality Date    HX CARPAL TUNNEL RELEASE      HX SHOULDER ARTHROSCOPY Left     SHOULDER SURGERY       Family Medical History:       Problem Relation (Age of Onset)    No Known Problems Mother, Father            Allergies:  Allergies   Allergen Reactions    Amoxicillin-Pot Clavulanate  Other Adverse Reaction (Add comment)    Vancomycin  Other Adverse Reaction (Add comment)       Medications:  Current Outpatient Medications   Medication Sig    aspirin (ECOTRIN) 81 mg Oral Tablet, Delayed Release (E.C.) Take 1 tablet every Harshberger by oral route.    atorvastatin (LIPITOR) 40 mg Oral Tablet Take 1 Tablet (40 mg total) by mouth Once a Waddell    hydroCHLOROthiazide (HYDRODIURIL) 12.5 mg Oral Tablet TAKE 1 TABLET BY ORAL ROUTE M,W,FRI    metFORMIN (GLUCOPHAGE) 500 mg Oral Tablet Take 1 Tablet (500 mg total) by mouth    montelukast (SINGULAIR) 10 mg Oral Tablet     olmesartan (BENICAR) 20 mg Oral Tablet Take 1 Tablet (20 mg total) by  mouth Once a Mcclenathan as directed    promethazine (PHENERGAN) 6.25 mg/5 mL Oral Syrup Take 5 mL (6.25 mg total) by mouth Every 6 hours as needed (cough) (Patient not taking: Reported on 12/04/2022)    VENTOLIN HFA 90 mcg/actuation Inhalation oral inhaler Take 1-2 Puffs by inhalation Every 6 hours as needed       Problem List:  Patient Active Problem List    Diagnosis    Osteoarthritis of patellofemoral joint    Tear of meniscus of left knee    Type 2 diabetes mellitus (CMS HCC)    Nicotine dependence    Essential hypertension    Chronic cough    Allergic rhinitis       Review of Systems:  Constitutional: The patient denied pain, obesity, recent illness, chills, diaphoresis, fatigue, fever and malaise.  Cardiovascular: The patient denied arrhythmia, chest pain/pressure, dyspnea and edema.  Respiratory: The patient denied asthma, chest congestion, chest tightness, shortness of breath and cough.  Gastrointestinal: The patient denied gastroesophageal reflux, rectal bleeding, rectal  pain, weight loss, perianal pain, abdominal pain, perirectal issues, anorexia, constipation, diarrhea, gas and bloating, nausea and vomiting.  Genitourinary/Nephrology: The patient denied burning urination, difficulty urinating and urinating at night.  Musculoskeletal: The patient denied fall in last 6 months, leg swelling, pain while walking and back pain.  Dermatologic: The patient denied rash.  Neurologic: The patient denied rash.  Neurologic: The denied light headedness, syncope and weakness.  Endocrine: The patient denied thyroid disorder, diabetes mellitus type 1 and diabetes mellitus type 2.  Hematologic/Lymphatic: The patient denied anemia and DVT.            Objective     Vitals:  Ht 1.702 m (5\' 7" )   Wt 108 kg (238 lb)   BMI 37.28 kg/m       Wt Readings from Last 5 Encounters:   07/03/23 108 kg (238 lb)   05/15/23 111 kg (244 lb)   12/04/22 110 kg (243 lb)       Physical Exam:  Patient walks with a antalgic gait  He has got a 2+  effusion to his knee   Patient is extension is -1 or 2   Flexion to 105   Pain with compression of the medial compartment   Pain with distraction and palpation medial joint line   ACL PCL MCL and LCL intact   Patella tracking normal   Strength is reasonable and normal    I have reviewed all available imaging and laboratory studies as appropriate.  Labs:  No visits with results within 1 Month(s) from this visit.   Latest known visit with results is:   No results found for any previous visit.       Imaging:   MRI scan by my review shows a complex tear of the medial meniscus   My review of the ACL PCL are intact   The lateral compartment meniscus was intact.  No evidence of fracture loose body noted.    Assessment     Diagnosis:    ICD-10-CM    1. Tear of meniscus of left knee  S83.207A         Encounter Medications and Orders:  No orders of the defined types were placed in this encounter.      Plan     Due patient has not responded to injection anti-inflammatories activity modification including stretching and the fact that the patient was nearly walking with a cane today.  The patient has an effusion that is recurrent he has exam this consistent with a medial meniscus tear as an MRI scan of the consistent with a meniscus tear we are going to go ahead and proceed with an arthroscopic meniscectomy chondroplasty be done in the next few weeks a [facility]  Risks and benefits were explained the lengthy seems understand as well as hospital course a postoperative course have been explained as well    Proposed procedure:  Arthroscopy left knee with a partial meniscectomy chondroplasty as required  No follow-ups on file.    Kevin Alexandria, MD

## 2023-07-07 ENCOUNTER — Other Ambulatory Visit: Payer: Self-pay

## 2023-07-07 ENCOUNTER — Other Ambulatory Visit (HOSPITAL_COMMUNITY): Payer: Self-pay | Admitting: ORTHOPAEDIC SURGERY

## 2023-07-07 ENCOUNTER — Encounter (HOSPITAL_COMMUNITY): Payer: Self-pay | Admitting: ORTHOPAEDIC SURGERY

## 2023-07-07 DIAGNOSIS — Z01818 Encounter for other preprocedural examination: Secondary | ICD-10-CM

## 2023-07-07 NOTE — OR PreOp (Signed)
[facility]   Pre-Admission Testing Nurse: [phone]  Surgery Date: 08/06/2023  Arrive at: [facility]  Procedure: ARTHROSCOPY KNEE WITH MENISECTOMY, MEDIAL-LEFT: 29881 (CPT)    Bring a complete and accurate list of your medications (including dosages) AND past medical and surgical history. This helps to ensure you received the safest care possible.   If you are taking any over-the-counter substances, such as herbal supplements, diet pills, pain relievers or other non prescription medication, please notify the Pre-Admission Testing Nurse, your doctor and any other health team member interviewing you or performing any assessment. DO NOT take any over-the-counter medications for 8 days prior to your procedure, unless otherwise instructed by your physician. Failure to disclose this information could be life threatening.   DO NOT eat or drink anything after midnight prior to surgery including: NO GUM, NO MINTS, NO TOBACCO OR TOBACCO PRODUCTS OF ANY KIND - 24 HOURS PRIOR TO SURGERY. (including but not limited to: cigarettes, cigars, chewing tobacco, snuff, pipe, vaping etc.), Approved medications may be taken with a sip of water the morning of your surgery. You may brush your teeth the morning of your surgery.   Do not wear make-up, lotions, or colored fingernail polish the Stagner of surgery.   Leave all jewelry (including piercing) at home due to the risk of injury. No jewelry will be permitted in the Operating Room. ALL piercings must be removed, metal, plastic, silicone, etc.   If you have any prosthetics, please inform your pre-admission testing nurse.   Wear loose-fitting, comfortable clothing.   If you require reading glasses or hearing aids, please bring them with you the [place] of surgery. Your dentures can remain in place until immediately before your procedure, but do not use any denture adhesive. They will be returned once you are awake and alert in the Recovery Room.   If you require interpretive  services, our facility will provide these for you. Please ask if you have any questions regarding this service.   If required for your procedure, you will be given a Hibiclens Soap Kit with instructions for use at home. The kit is to be used the night before AND the morning of surgery following the instructions given.   When you arrive at the area specified, the nurse will prepare you for your procedure and complete any additional blood work/paperwork. An IV will be started as needed.   One support person/family member will be able to join you in the pre-operative area once the per-op nurse has prepared you for your procedure. Children may have both parents with them.   When you are taken to the Operating Room, your family will be shown to the waiting room. Your family will be provided with a case number so they can track the progress of your surgical case while in the waiting area. When your procedure has been completed, your surgeon will speak to your family.   After your procedure, you will be taken to the Recovery Room until you are awake and alert (30 minutes - 2 hours). You will then be transported to the appropriate area for further care.   You can expect to experience some discomfort and possible nausea. Discuss this with your nurse so that medications can be given, if appropriate.   Some procedures may require you to assume a special position in the recovery period. We will explain this if it applies to you.   It may also be necessary for you to have tubes after surgery (catheter,  drains, from incision).   After surgery, you will need to breathe deeply and to cough (helping to prevent a form of pneumonia that can occur after having general anesthesia).   Medications to be taken the morning of surgery with a sip of water: BENICAR, HYDROCHLOROTHIAZIDE, SINGULAIR, INHALER (IF NEEDED)   Last dose of Metformin 48 hours prior to surgery: 08/04/2023 0700   Additional comments: PAT 07/20/2023, NO  SMOKING 24 HRS PRIOR TO SURGERY, NO OTC MEDS 8 DAYS PRIOR TO SURGERY   If you use CPAP/BiPAP, bring your machine with you on [place] of surgery if you will be staying overnight.  For those patients having same-Cryan surgery:   Arrange for transportation home with a responsible adult who will remain with you for 24 hours after your procedure. The adult must be present with you prior to the procedure. You will not be allowed to proceed with the planned surgery if you are not accompanied by an appropriate post-procedure caregiver.   When you are able to tolerate fluids, urinate, etc, you will receive discharge instructions for your care at home (both verbal and written).   After completing all of the above, you will be discharged to home with instructions to follow-up   If you have any additional questions, please contact your surgeon's office or the Pre-Admission Testing Nurse at [phone].   Please follow any specific instructions from you surgeon's office concerning aspirin/ other blood thinners, physical therapy, or bowel preps/enemas, etc. Per surgeon    SIGNATURE: ___________________________________________________.    DATE:  ___________________________________________________.

## 2023-07-07 NOTE — OR PreOp (Signed)
No chest pain, dr Isidoro Donning is his cardiologist

## 2023-07-20 ENCOUNTER — Other Ambulatory Visit: Payer: Self-pay

## 2023-07-20 ENCOUNTER — Ambulatory Visit
Admission: RE | Admit: 2023-07-20 | Discharge: 2023-07-20 | Disposition: A | Discharge status: Home / Self Care | Payer: BC Managed Care – PPO | Source: Ambulatory Visit | Attending: ORTHOPAEDIC SURGERY | Admitting: ORTHOPAEDIC SURGERY

## 2023-07-20 ENCOUNTER — Encounter (HOSPITAL_COMMUNITY): Payer: Self-pay | Admitting: ORTHOPAEDIC SURGERY

## 2023-07-20 DIAGNOSIS — Z01818 Encounter for other preprocedural examination: Secondary | ICD-10-CM | POA: Insufficient documentation

## 2023-07-20 LAB — CBC WITH DIFF
BASOPHIL #: <0.1 10*3/uL (ref <=0.20)
BASOPHIL %: 0.6 %
EOSINOPHIL #: 0.11 10*3/uL (ref <=0.50)
EOSINOPHIL %: 1.3 %
HCT: 38.7 % — ABNORMAL LOW (ref 38.9–52.0)
HGB: 12.6 g/dL — ABNORMAL LOW (ref 13.4–17.5)
IMMATURE GRANULOCYTE #: <0.1 10*3/uL (ref <0.10)
IMMATURE GRANULOCYTE %: 0 % (ref 0.0–1.0)
LYMPHOCYTE #: 2.83 10*3/uL (ref 1.00–4.80)
LYMPHOCYTE %: 34.2 %
MCH: 29.3 pg (ref 26.0–32.0)
MCHC: 32.6 g/dL (ref 31.0–35.5)
MCV: 90 fL (ref 78.0–100.0)
MONOCYTE #: 0.57 10*3/uL (ref 0.20–1.10)
MONOCYTE %: 6.9 %
MPV: 12.1 fL (ref 8.7–12.5)
NEUTROPHIL #: 4.71 10*3/uL (ref 1.50–7.70)
NEUTROPHIL %: 57 %
PLATELETS: 230 10*3/uL (ref 150–400)
RBC: 4.3 10*6/uL — ABNORMAL LOW (ref 4.50–6.10)
RDW-CV: 13.3 % (ref 11.5–15.5)
WBC: 8.3 10*3/uL (ref 3.7–11.0)

## 2023-07-20 LAB — BASIC METABOLIC PANEL
ANION GAP: 10 mmol/L
BUN/CREA RATIO: 10
BUN: 10 mg/dL (ref 8–23)
CALCIUM: 8.8 mg/dL (ref 8.3–10.7)
CHLORIDE: 104 mmol/L (ref 96–106)
CO2 TOTAL: 25 mmol/L (ref 22–30)
CREATININE: 1.03 mg/dL (ref 0.70–1.20)
ESTIMATED GFR: 82 mL/min/{1.73_m2} — ABNORMAL LOW (ref >90)
GLUCOSE: 108 mg/dL (ref 74–109)
POTASSIUM: 4.3 mmol/L (ref 3.2–5.0)
SODIUM: 139 mmol/L (ref 133–144)

## 2023-07-20 NOTE — H&P (Signed)
[facility]  [address]  [place] [address]  Preoperative History and Physical       Patient Name: [name]  Age: 62 y.o.  Sex: male  DOB: 1961-05-09  MRN: [id number]  Date: 08/06/23    Planned Procedure:   Left knee arthroscopy with partial meniscectomy chondroplasty    HISTORY OF PRESENT ILLNESS:    [name] is a 62 y.o. year old male who presents today for left knee pain. Patient gives a history of a fall in December 2023 and has had pain, swelling, and limited ROM since that time. Patient has tried anti-inflammatories, injections, and activity modifications. Patient had a MRI which showed a medial meniscus tear.  Patient has continued to have increasing pain and swelling and has elected to proceed with surgery    Past Medical History:  Past Medical History:   Diagnosis Date    Chronic obstructive airway disease (CMS HCC)     CPAP (continuous positive airway pressure) dependence     Diabetes mellitus (CMS HCC)     Essential hypertension     Personal history of colonic polyps     Sleep apnea     Type 2 diabetes mellitus (CMS HCC)     Wears glasses         Surgical History:  Past Surgical History:   Procedure Laterality Date    CATARACT EXTRACTION W/ INTRAOCULAR LENS IMPLANT Bilateral     COLONOSCOPY      HX CARPAL TUNNEL RELEASE      HX HEART CATHETERIZATION      HX SHOULDER ARTHROSCOPY Left     SHOULDER SURGERY          Family History:  Family Medical History:       Problem Relation (Age of Onset)    No Known Problems Mother, Father              Social History:  Social History     Tobacco Use    Smoking status: Former     Current packs/Ruderman: 1.00     Average packs/Cajas: 1 pack/Ybarbo for 29.6 years (29.6 ttl pk-yrs)     Types: Cigarettes     Start date: 1995    Smokeless tobacco: Never   Vaping Use    Vaping status: Never Used   Substance Use Topics    Alcohol use: Not Currently    Drug use: Never       HOME MEDICATIONS:  Prior to Admission  medications    Medication Sig Start Date End Date Taking? Authorizing Provider   aspirin (ECOTRIN) 81 mg Oral Tablet, Delayed Release (E.C.) Take 1 Tablet (81 mg total) by mouth Once a [place]    Provider, Historical   atorvastatin (LIPITOR) 40 mg Oral Tablet Take 1 Tablet (40 mg total) by mouth Once a Houlton    Provider, Historical   hydroCHLOROthiazide (HYDRODIURIL) 12.5 mg Oral Tablet Take 1 Tablet (12.5 mg total) by mouth Once a Cardona    Provider, Historical   metFORMIN (GLUCOPHAGE XR) 500 mg Oral Tablet Sustained Release 24 hr Take 1 Tablet (500 mg total) by mouth Once a Colvin    Provider, Historical   montelukast (SINGULAIR) 10 mg Oral Tablet Take 1 Tablet (10 mg total) by mouth Once a Ruggles 12/03/22   Provider, Historical   olmesartan (BENICAR) 20 mg Oral Tablet Take 1 Tablet (20 mg total) by mouth Once a Vasconcelos as directed    Provider, Historical  promethazine (PHENERGAN) 6.25 mg/5 mL Oral Syrup Take 5 mL (6.25 mg total) by mouth Every 6 hours as needed (cough)  Patient not taking: Reported on 12/04/2022 10/14/22   Duard Brady, DO   VENTOLIN HFA 90 mcg/actuation Inhalation oral inhaler Take 1-2 Puffs by inhalation Every 6 hours as needed    Provider, Historical   metFORMIN (GLUCOPHAGE) 500 mg Oral Tablet Take 1 Tablet (500 mg total) by mouth  07/07/23  Provider, Historical        ALLERGIES:    Allergies   Allergen Reactions    Amoxicillin-Pot Clavulanate  Other Adverse Reaction (Add comment)    Vancomycin  Other Adverse Reaction (Add comment)        REVIEW OF SYSTEMS:   Review of Systems   Constitutional:  Negative for chills and fever.   HENT:  Negative for hearing loss.    Respiratory:  Negative for cough and shortness of breath.    Cardiovascular:  Negative for chest pain.   Musculoskeletal:         Left knee pain see HPI       Labs and Imaging:  No results found for this or any previous visit (from the past 168 hour(s)).        PHYSICAL EXAMINATION:     Vital Signs:    Vitals:    07/07/23 1108   Weight: 108 kg (238 lb)    Height: 1.702 m (5\' 7" )   BMI: 37.35          Physical Exam  Constitutional:       General: He is not in acute distress.     Appearance: Normal appearance.   HENT:      Head: Normocephalic.      Nose: Nose normal.      Mouth/Throat:      Mouth: Mucous membranes are moist.   Eyes:      Extraocular Movements: Extraocular movements intact.   Cardiovascular:      Rate and Rhythm: Normal rate and regular rhythm.   Pulmonary:      Effort: Pulmonary effort is normal.      Breath sounds: Normal breath sounds.   Abdominal:      General: Bowel sounds are normal.      Palpations: Abdomen is soft.   Musculoskeletal:         General: No swelling. Normal range of motion.      Comments: swelling of left knee   Skin:     General: Skin is warm and dry.      Capillary Refill: Capillary refill takes less than 2 seconds.   Neurological:      Mental Status: He is alert and oriented to person, place, and time.   Psychiatric:         Mood and Affect: Mood normal.          ASSESSMENT AND PLAN:  Tear of meniscus of left knee  Surgery above    Orinda Kenner, APRN   07/20/2023 0741      This note may have been partially generated using MModal Fluency Direct system, and there may be some incorrect words, spellings, and punctuation that were not noted in checking the note before saving.

## 2023-08-06 ENCOUNTER — Encounter (HOSPITAL_COMMUNITY): Payer: Self-pay | Admitting: ORTHOPAEDIC SURGERY

## 2023-08-06 ENCOUNTER — Inpatient Hospital Stay
Admission: RE | Admit: 2023-08-06 | Discharge: 2023-08-06 | Disposition: A | Discharge status: Home / Self Care | Payer: BC Managed Care – PPO | Source: Ambulatory Visit | Attending: ORTHOPAEDIC SURGERY | Admitting: ORTHOPAEDIC SURGERY

## 2023-08-06 ENCOUNTER — Ambulatory Visit (HOSPITAL_COMMUNITY): Payer: BC Managed Care – PPO | Admitting: Anesthesiology

## 2023-08-06 ENCOUNTER — Encounter (HOSPITAL_COMMUNITY)
Admission: RE | Disposition: A | Discharge status: Home / Self Care | Payer: Self-pay | Source: Ambulatory Visit | Attending: ORTHOPAEDIC SURGERY

## 2023-08-06 ENCOUNTER — Other Ambulatory Visit: Payer: Self-pay

## 2023-08-06 ENCOUNTER — Encounter (HOSPITAL_COMMUNITY): Payer: BC Managed Care – PPO | Admitting: ORTHOPAEDIC SURGERY

## 2023-08-06 DIAGNOSIS — J449 Chronic obstructive pulmonary disease, unspecified: Secondary | ICD-10-CM | POA: Insufficient documentation

## 2023-08-06 DIAGNOSIS — S83242A Other tear of medial meniscus, current injury, left knee, initial encounter: Secondary | ICD-10-CM | POA: Insufficient documentation

## 2023-08-06 DIAGNOSIS — G4730 Sleep apnea, unspecified: Secondary | ICD-10-CM | POA: Insufficient documentation

## 2023-08-06 DIAGNOSIS — M94262 Chondromalacia, left knee: Secondary | ICD-10-CM | POA: Insufficient documentation

## 2023-08-06 DIAGNOSIS — Z7984 Long term (current) use of oral hypoglycemic drugs: Secondary | ICD-10-CM | POA: Insufficient documentation

## 2023-08-06 DIAGNOSIS — E119 Type 2 diabetes mellitus without complications: Secondary | ICD-10-CM | POA: Insufficient documentation

## 2023-08-06 DIAGNOSIS — I10 Essential (primary) hypertension: Secondary | ICD-10-CM | POA: Insufficient documentation

## 2023-08-06 HISTORY — DX: Chronic obstructive pulmonary disease, unspecified (CMS HCC): J44.9

## 2023-08-06 HISTORY — DX: Presence of spectacles and contact lenses: Z97.3

## 2023-08-06 HISTORY — DX: Unspecified tear of unspecified meniscus, current injury, left knee, initial encounter: S83.207A

## 2023-08-06 HISTORY — DX: Personal history of colonic polyps: Z86.010

## 2023-08-06 HISTORY — DX: Dependence on other enabling machines and devices: Z99.89

## 2023-08-06 HISTORY — DX: Type 2 diabetes mellitus without complications (CMS HCC): E11.9

## 2023-08-06 LAB — POC BLOOD GLUCOSE (RESULTS)
GLUCOSE, POC: 98 mg/dl (ref 70–100)
GLUCOSE, POC: 106 mg/dl — ABNORMAL HIGH (ref 70–100)

## 2023-08-06 SURGERY — ARTHROSCOPY KNEE WITH MENISECTOMY
Anesthesia: General | Site: Knee | Laterality: Left | Wound class: Clean Wound: Uninfected operative wounds in which no inflammation occurred

## 2023-08-06 MED ORDER — MEPERIDINE (PF) 25 MG/ML INJECTION SYRINGE
12.5000 mg | INJECTION | INTRAMUSCULAR | Status: DC | PRN
Start: 2023-08-06 — End: 2023-08-06

## 2023-08-06 MED ORDER — LABETALOL 20 MG/4 ML (5 MG/ML) INTRAVENOUS SYRINGE
5.0000 mg | INJECTION | Freq: Once | INTRAVENOUS | Status: DC | PRN
Start: 2023-08-06 — End: 2023-08-06

## 2023-08-06 MED ORDER — MORPHINE 10 MG/ML INJECTION WRAPPER
Freq: Once | INTRAVENOUS | Status: DC | PRN
Start: 2023-08-06 — End: 2023-08-06
  Administered 2023-08-06: 10 mg via INTRAVENOUS

## 2023-08-06 MED ORDER — HYDROMORPHONE 0.5 MG/0.5 ML INJECTION SYRINGE
0.2500 mg | INJECTION | INTRAMUSCULAR | Status: DC | PRN
Start: 2023-08-06 — End: 2023-08-06

## 2023-08-06 MED ORDER — ACETAMINOPHEN 1,000 MG/100 ML (10 MG/ML) INTRAVENOUS SOLUTION
1000.0000 mg | Freq: Once | INTRAVENOUS | Status: AC
Start: 2023-08-06 — End: 2023-08-06
  Administered 2023-08-06: 1000 mg via INTRAVENOUS

## 2023-08-06 MED ORDER — LIDOCAINE (PF) 100 MG/5 ML (2 %) INTRAVENOUS SYRINGE
INJECTION | Freq: Once | INTRAVENOUS | Status: DC | PRN
Start: 2023-08-06 — End: 2023-08-06
  Administered 2023-08-06: 60 mg via INTRAVENOUS

## 2023-08-06 MED ORDER — HYDROMORPHONE 0.5 MG/0.5 ML INJECTION SYRINGE
0.5000 mg | INJECTION | INTRAMUSCULAR | Status: DC | PRN
Start: 2023-08-06 — End: 2023-08-06

## 2023-08-06 MED ORDER — ONDANSETRON HCL (PF) 4 MG/2 ML INJECTION SOLUTION
Freq: Once | INTRAMUSCULAR | Status: DC | PRN
Start: 2023-08-06 — End: 2023-08-06
  Administered 2023-08-06: 4 mg via INTRAVENOUS

## 2023-08-06 MED ORDER — SUCCINYLCHOLINE 20 MG/ML INTRAVENOUS WRAPPER
INJECTION | Freq: Once | INTRAVENOUS | Status: DC | PRN
Start: 2023-08-06 — End: 2023-08-06
  Administered 2023-08-06: 120 mg via INTRAVENOUS

## 2023-08-06 MED ORDER — ONDANSETRON 4 MG DISINTEGRATING TABLET
4.0000 mg | ORAL_TABLET | Freq: Once | ORAL | Status: AC
Start: 2023-08-06 — End: 2023-08-06
  Administered 2023-08-06: 4 mg via ORAL

## 2023-08-06 MED ORDER — APREPITANT 40 MG CAPSULE
40.0000 mg | ORAL_CAPSULE | Freq: Once | ORAL | Status: AC
Start: 2023-08-06 — End: 2023-08-06
  Administered 2023-08-06: 40 mg via ORAL

## 2023-08-06 MED ORDER — FAMOTIDINE 20 MG TABLET
20.0000 mg | ORAL_TABLET | Freq: Once | ORAL | Status: AC
Start: 2023-08-06 — End: 2023-08-06
  Administered 2023-08-06: 20 mg via ORAL

## 2023-08-06 MED ORDER — FAMOTIDINE 20 MG TABLET
ORAL_TABLET | ORAL | Status: AC
Start: 2023-08-06 — End: 2023-08-06
  Filled 2023-08-06: qty 1

## 2023-08-06 MED ORDER — LACTATED RINGERS INTRAVENOUS SOLUTION
INTRAVENOUS | Status: DC
Start: 2023-08-06 — End: 2023-08-06

## 2023-08-06 MED ORDER — PROCHLORPERAZINE EDISYLATE 10 MG/2 ML (5 MG/ML) INJECTION SOLUTION
5.0000 mg | Freq: Once | INTRAMUSCULAR | Status: DC | PRN
Start: 2023-08-06 — End: 2023-08-06

## 2023-08-06 MED ORDER — SODIUM CHLORIDE 0.9 % (FLUSH) INJECTION SYRINGE
3.0000 mL | INJECTION | Freq: Three times a day (TID) | INTRAMUSCULAR | Status: DC
Start: 2023-08-06 — End: 2023-08-06

## 2023-08-06 MED ORDER — ONDANSETRON HCL (PF) 4 MG/2 ML INJECTION SOLUTION
4.0000 mg | Freq: Once | INTRAMUSCULAR | Status: DC | PRN
Start: 2023-08-06 — End: 2023-08-06

## 2023-08-06 MED ORDER — CEFAZOLIN 2 GRAM/100 ML IN DEXTROSE(ISO-OSMOTIC) INTRAVENOUS PIGGYBACK
INJECTION | Freq: Once | INTRAVENOUS | Status: DC | PRN
Start: 2023-08-06 — End: 2023-08-06
  Administered 2023-08-06: 2 g via INTRAVENOUS

## 2023-08-06 MED ORDER — APREPITANT 40 MG CAPSULE
ORAL_CAPSULE | ORAL | Status: AC
Start: 2023-08-06 — End: 2023-08-06
  Filled 2023-08-06: qty 1

## 2023-08-06 MED ORDER — CEFAZOLIN 2 GRAM/50 ML IN DEXTROSE (ISO-OSMOTIC) INTRAVENOUS PIGGYBACK
INJECTION | INTRAVENOUS | Status: AC
Start: 2023-08-06 — End: 2023-08-06
  Filled 2023-08-06: qty 50

## 2023-08-06 MED ORDER — LACTATED RINGERS INTRAVENOUS SOLUTION
INTRAVENOUS | Status: DC | PRN
Start: 2023-08-06 — End: 2023-08-06

## 2023-08-06 MED ORDER — FENTANYL (PF) 50 MCG/ML INJECTION SOLUTION
INTRAMUSCULAR | Status: AC
Start: 2023-08-06 — End: 2023-08-06
  Filled 2023-08-06: qty 2

## 2023-08-06 MED ORDER — FENTANYL (PF) 50 MCG/ML INJECTION WRAPPER
INJECTION | Freq: Once | INTRAMUSCULAR | Status: DC | PRN
Start: 2023-08-06 — End: 2023-08-06
  Administered 2023-08-06: 50 ug via INTRAVENOUS

## 2023-08-06 MED ORDER — ENALAPRILAT 1.25 MG/ML INTRAVENOUS SOLUTION
1.2500 mg | Freq: Once | INTRAVENOUS | Status: DC
Start: 2023-08-06 — End: 2023-08-06

## 2023-08-06 MED ORDER — BUPIVACAINE (PF) 0.5 % (5 MG/ML) INJECTION SOLUTION
Freq: Once | INTRAMUSCULAR | Status: DC | PRN
Start: 2023-08-06 — End: 2023-08-06
  Administered 2023-08-06: 25 mL

## 2023-08-06 MED ORDER — MAGNESIUM SULFATE 2 GRAM/50 ML (4 %) IN WATER INTRAVENOUS PIGGYBACK
INJECTION | Freq: Once | INTRAVENOUS | Status: DC | PRN
Start: 2023-08-06 — End: 2023-08-06
  Administered 2023-08-06: 2 g via INTRAVENOUS

## 2023-08-06 MED ORDER — ROCURONIUM 10 MG/ML INTRAVENOUS SOLUTION
Freq: Once | INTRAVENOUS | Status: DC | PRN
Start: 2023-08-06 — End: 2023-08-06
  Administered 2023-08-06: 30 mg via INTRAVENOUS
  Administered 2023-08-06: 5 mg via INTRAVENOUS

## 2023-08-06 MED ORDER — SODIUM CHLORIDE 0.9 % (FLUSH) INJECTION SYRINGE
3.0000 mL | INJECTION | INTRAMUSCULAR | Status: DC | PRN
Start: 2023-08-06 — End: 2023-08-06

## 2023-08-06 MED ORDER — ONDANSETRON 4 MG DISINTEGRATING TABLET
ORAL_TABLET | ORAL | Status: AC
Start: 2023-08-06 — End: 2023-08-06
  Filled 2023-08-06: qty 1

## 2023-08-06 MED ORDER — MORPHINE (PF) 1 MG/ML INJECTION SOLUTION
INTRAMUSCULAR | Status: AC
Start: 2023-08-06 — End: 2023-08-06
  Filled 2023-08-06: qty 10

## 2023-08-06 MED ORDER — ACETAMINOPHEN 1,000 MG/100 ML (10 MG/ML) INTRAVENOUS SOLUTION
INTRAVENOUS | Status: AC
Start: 2023-08-06 — End: 2023-08-06
  Filled 2023-08-06: qty 100

## 2023-08-06 MED ORDER — ALBUTEROL SULFATE 2.5 MG/3 ML (0.083 %) SOLUTION FOR NEBULIZATION
2.5000 mg | INHALATION_SOLUTION | Freq: Once | RESPIRATORY_TRACT | Status: DC | PRN
Start: 2023-08-06 — End: 2023-08-06

## 2023-08-06 MED ORDER — SUGAMMADEX 100 MG/ML INTRAVENOUS SOLUTION
Freq: Once | INTRAVENOUS | Status: DC | PRN
Start: 2023-08-06 — End: 2023-08-06
  Administered 2023-08-06: 200 mg via INTRAVENOUS

## 2023-08-06 MED ORDER — IPRATROPIUM 0.5 MG-ALBUTEROL 3 MG (2.5 MG BASE)/3 ML NEBULIZATION SOLN
3.0000 mL | INHALATION_SOLUTION | Freq: Once | RESPIRATORY_TRACT | Status: DC | PRN
Start: 2023-08-06 — End: 2023-08-06

## 2023-08-06 MED ORDER — PROPOFOL 10 MG/ML IV BOLUS
INJECTION | Freq: Once | INTRAVENOUS | Status: DC | PRN
Start: 2023-08-06 — End: 2023-08-06
  Administered 2023-08-06: 120 mg via INTRAVENOUS

## 2023-08-06 SURGICAL SUPPLY — 15 items
BANDAGE ESMARK 9FTX6IN STRL SYN COMPRESS LF (WOUND CARE SUPPLY) ×1 IMPLANT
BLADE 15 BD CNV SS SURG TISS STRL LF  DISP (SURGICAL CUTTING SUPPLIES) ×1 IMPLANT
BLADE SHAVER 13CM 4MM COOLCUT STRL DISP (ENDOSCOPIC SUPPLIES) ×1 IMPLANT
BLADE SHAVER 13CM 4MM EXCLBR C_OOLCUT STRL DISP (ENDOSCOPIC SUPPLIES) ×1 IMPLANT
CUFF TOURNIQUET PURP 34X4IN COLOR CUF CYL 1 PORT 1 BLADDER QC CONN 40IN STRL LF  DISP (MED SURG SUPPLIES) ×1 IMPLANT
CUFF TOURNIQUET RYL BLU 30X4IN COLOR CUF CYL LOW PROF 1 PORT 1 BLADDER LIMB PROTECT SLEEVE REINF (MED SURG SUPPLIES) ×1 IMPLANT
ELECTRODE PATIENT RTN 9FT VLAB C30- LB RM PHSV ACRL FOAM CORD NONIRRITATE NONSENSITIZE ADH STRP (SURGICAL CUTTING SUPPLIES) ×1 IMPLANT
GOWN SURG 2XL XLNG L4 IMPRV REINF BRTHBL STRL LF  DISP SMARTSLEEVE POLY (DRAPE/PACKS/SHEETS/OR TOWEL) ×1 IMPLANT
PACK ARTHRO SURG PROC N/M STRL LF DISP (CUSTOM TRAYS & PACK) ×1 IMPLANT
PROBE ESURG APOLLORF 90D XL ASPIRATE ABLATOR (SURGICAL CUTTING SUPPLIES) ×1 IMPLANT
PUMP TUBING 13FT CONT WV III DUALWAVE ARTHRO STRL DISP (MED SURG SUPPLIES) ×1 IMPLANT
SPONGE GAUZE 4X4IN 16 PLY LF  NONST DISP WHT (WOUND CARE SUPPLY) ×1 IMPLANT
SYRINGE LL 10ML LF  STRL GRAD N-PYRG DEHP-FR PVC FREE MED DISP (MED SURG SUPPLIES) ×2 IMPLANT
TOWEL OR STRL 27X17IN PREWASH DELINT ABS BLU DISP STD STR COTTON LF (DRAPE/PACKS/SHEETS/OR TOWEL) ×1 IMPLANT
TUBING SUCT CLR 10FT .25IN MEDIVAC MXGR NCDTV MALE TO MALE CONN STRL LF  DISP (MED SURG SUPPLIES) ×1 IMPLANT

## 2023-08-06 NOTE — OR Surgeon (Signed)
[facility]                                            OPERATIVE NOTE    Patient Name: [name], Grosser Number: [id number]  Date of Service: 08/06/2023   Date of Birth: [birth year]    All elements must be documented.  Pre-Operative Diagnosis: Pre-Op Diagnosis Codes:     * Oth tear of medial meniscus, current injury, left knee, init [S83.242A]  Post-Operative Diagnosis: same  Procedure(s)/Description:  Procedure(s):  ARTHROSCOPY LEFT KNEE WITH MENISECTOMY, MEDIAL  Chondroplasty patellofemoral joint  Chondroplasty medial femoral condyle  Surgeon: [name], MD  Assistant(s):   Implants: * No implants in log *  Specimens:  None  Anesthesia Type: General    Estimated Blood Loss: 5 cc    Complications (not routinely expected or not inherent to difficulty/nature of procedure): None    Findings:  med meniscus tear, Osteoarthritis med compartment OA     Description of the Procedure:  Patient was taken OR laid supine position general anesthetic was administered patient was given IV antibiotics time-out was performed prepped and draped in usual manner for arthroscopic examination left knee.    Arthroscopic portals were established after local anesthetic was from placed at the site visualization was done medial compartment revealed chondral changes to grade 3 level with a degenerative meniscus tear on the posterior medial 3rd pair the the intercondylar notch was unremarkable the lateral compartment showed essentially reasonable cartilage bearing surface with the good car good meniscus with the patellofemoral joint showed trochlear changes severe grade 2 3 no no exposed bone was noted at the patella or the trochlea.    The scope was placed in the medial compartment where a chondroplasty was performed of the medial femoral condyle had a meniscectomy was performed with a with a posterior 3rd mid 3rd section of the meniscus using shaver and baskets.  Chondroplasty was also performed of the  patellofemoral joint particularly of the trochlea level.  The knee was lavaged.  The knee was drained and then injected with Duramorph and Marcaine.  Closed with 3-0 Monocryl suture placed in sterile dressing then taken recovery              [name], MD

## 2023-08-06 NOTE — H&P (Signed)
[facility]      H&P UPDATE FORM                                                                                  [name], [name], 62 y.o. male  Date of Admission:  (Not on file)  Date of Birth:  August 01, 1961    08/06/2023    STOP: IF H&P IS GREATER THAN 30 DAYS FROM SURGICAL [name] COMPLETE NEW H&P IS REQUIRED.     H & P updated the [name] of the procedure.  1.  H&P completed within 30 days of surgical procedure and has been reviewed within 24 hours of admission but prior to surgery or a procedure requiring anesthesia services, the patient has been examined, and no change has occured in the patients condition since the H&P was completed.       Change in medications: Yes              Comments:     2.  Patient continues to be appropriate candidate for planned surgical procedure. YES    [name] Alexandria, MD

## 2023-08-06 NOTE — Anesthesia Procedure Notes (Signed)
[name]    Airway Note  General Information and Staff   Authorizing provider: Milas Hock, MD  Performing provider: Audree Camel        Urgency: elective    Airway not difficult    Indications and Patient Condition  Pt location: In Or  Indications for airway management: anesthesia and airway protection  Spontaneous ventilation: present  Sedation level: deep  Preoxygenated: yes  Patient position: sniffing  Mask difficulty assessment: 2 - vent by mask + OA or adjuvant +/- NMBA        Final Airway Details  Final airway type: endotracheal airway        Successful airway: ETT and cuffed     Successful intubation technique: direct laryngoscopy              Endotracheal tube insertion site: oral  Blade: Macintosh  Blade size: #4  Airway size (mm): 8.0  Placement verified by: chest auscultation, capnometry and palpation of cuff   Marked at 21 and right  Measured from: lips  Secured with: Trach tie  Number of attempts at approach: 1  Number of other approaches attempted: 0Airway complications: Atraumatic

## 2023-08-06 NOTE — Anesthesia Transfer of Care (Signed)
ANESTHESIA TRANSFER OF CARE   [name] is a 62 y.o. ,male, Weight: 108 kg (237 lb)   had Procedure(s):  ARTHROSCOPY LEFT KNEE WITH MENISECTOMY, MEDIAL  performed  08/06/23   Primary Service: [name], MD    Past Medical History:   Diagnosis Date    Chronic obstructive airway disease (CMS HCC)     CPAP (continuous positive airway pressure) dependence     Diabetes mellitus (CMS HCC)     Essential hypertension     Personal history of colonic polyps     Sleep apnea     Tear of meniscus of left knee     Type 2 diabetes mellitus (CMS HCC)     Wears glasses       Allergy History as of 08/06/23       AMOXICILLIN-POT CLAVULANATE         Noted Status Severity Type Reaction    12/04/22 [address], [place], [place] 09/05/20 Active    Other Adverse Reaction (Add comment)              VANCOMYCIN         Noted Status Severity Type Reaction    12/04/22 [address], [place], [place] 05/30/20 Active    Other Adverse Reaction (Add comment)                  I completed my transfer of care / handoff to the receiving personnel during which we discussed:  Access, Airway, All key/critical aspects of case discussed, Analgesia, Antibiotics, Expectation of post procedure, Fluids/Product, Gave opportunity for questions and acknowledgement of understanding, Labs and PMHx  Report given to: Jene Every, RN    Post Location: PACU                                                             Last OR Temp: Temperature: 37 C (98.6 F)  ABG:  POTASSIUM   Date Value Ref Range Status   07/20/2023 4.3 3.2 - 5.0 mmol/L Final     CALCIUM   Date Value Ref Range Status   07/20/2023 8.8 8.3 - 10.7 mg/dL Final     Airway:  EndoTracheal Tube (Active)     Blood pressure 108/76, pulse 77, temperature 37 C (98.6 F), resp. rate 12, height 1.702 m (5\' 7" ), weight 108 kg (237 lb), SpO2 100%.

## 2023-08-06 NOTE — Discharge Instructions (Signed)
Discharged home  Follow-up 2 weeks  Rest, elevate, and ice or dura cold x3 days  Weightbear as tolerated  Limit ambulation mobility to home x3 days  Change dressing daily  Begin showers post 36 hr  No scrubbed or heavy rod over knee joint  Replace dressings with Band-Aids

## 2023-08-06 NOTE — Anesthesia Preprocedure Evaluation (Signed)
ANESTHESIA PRE-OP EVALUATION  Planned Procedure: ARTHROSCOPY LEFT KNEE WITH MENISECTOMY, MEDIAL (Left: Knee)  Review of Systems         patient summary reviewed  nursing notes reviewed        Pulmonary   COPD and sleep apnea,   Cardiovascular    Hypertension ,No peripheral edema,        GI/Hepatic/Renal           Endo/Other      type 2 diabetes    Neuro/Psych/MS        Cancer                        Physical Assessment      Airway       Mallampati: II    TM distance: <3 FB    Neck ROM: full  Mouth Opening: good.  No Facial hair          Dental           (+) poor dentition           Pulmonary    Breath sounds clear to auscultation  (-) no rhonchi, no decreased breath sounds, no wheezes, no rales and no stridor     Cardiovascular    Rhythm: regular  Rate: Normal  (-) no friction rub, carotid bruit is not present, no peripheral edema and no murmur     Other findings              Plan  ASA 3     Planned anesthesia type: general     general anesthesia with endotracheal tube intubation      PONV Plan:  I plan to administer pharmcologic prophalaxis antiemetics  POV PLAN:   plan for postoperative opioid use    SLEEP APNEA  Patient is at risk of obstructive sleep apnea and Education provided regarding risk of obstructive sleep apnea        Intravenous induction     Anesthesia issues/risks discussed are: Dental Injuries, Nerve Injuries, PONV, Blood Loss, Aspiration, Sore Throat, Intraoperative Awareness/ Recall, Cardiac Events/MI, Post-op Cognitive Dysfunction and Stroke.  Anesthetic plan and risks discussed with patient           Patient's NPO status is appropriate for Anesthesia.           Plan discussed with CRNA.

## 2023-08-06 NOTE — Anesthesia Postprocedure Evaluation (Signed)
Anesthesia Post Op Evaluation    Patient: [name]  Procedure(s):  ARTHROSCOPY LEFT KNEE WITH MENISECTOMY, MEDIAL    Last Vitals:Temperature: 37 C (98.6 F) (08/06/23 1443)  Heart Rate: 77 (08/06/23 1443)  BP (Non-Invasive): 108/76 (08/06/23 1443)  Respiratory Rate: 12 (08/06/23 1443)  SpO2: 100 % (08/06/23 1443)    No notable events documented.      Patient location during evaluation: bedside       Patient participation: complete - patient participated  Level of consciousness: awake and alert    Pain management: satisfactory to patient  Airway patency: patent    Anesthetic complications: no  Cardiovascular status: hemodynamically stable  Respiratory status: acceptable  Hydration status: acceptable  Patient post-procedure temperature: Pt Normothermic   PONV Status: Absent

## 2023-08-06 NOTE — Nurses Notes (Signed)
 Discharge education provided to patient and wife. Understanding verbalized. Patient escorted to private vehicle via wheelchair and assisted into vehicle. Care relinquished.

## 2023-08-06 NOTE — Discharge Summary (Signed)
[facility] SUMMARY (SNU)      PATIENT NAME:  [name], [name]  MRN:  [id number]  DOB:  [birth year]    ATTENDING PHYSICIAN: [name], MD    SERVICE: [facility]  PRIMARY CARE PHYSICIAN: Welton Flakes, DO     DISCHARGE SUMMARY:     Status post arthroscopic meniscectomy chondroplasty doing well staple pain well controlled dressing intact patient able to go home follow-up in 2 weeks    DISCHARGE MEDICATIONS:     Current Discharge Medication List        CONTINUE these medications - NO CHANGES were made during your visit.        Details   aspirin 81 mg Tablet, Delayed Release (E.C.)  Commonly known as: ECOTRIN   Take 1 Tablet (81 mg total) by mouth Once a Maltese  Refills: 0     atorvastatin 40 mg Tablet  Commonly known as: LIPITOR   1 Tablet, Oral, DAILY  Refills: 0     hydroCHLOROthiazide 12.5 mg Tablet  Commonly known as: HYDRODIURIL   Take 1 Tablet (12.5 mg total) by mouth Once a Wisby  Refills: 0     metFORMIN 500 mg Tablet Sustained Release 24 hr  Commonly known as: GLUCOPHAGE XR   500 mg, Oral, DAILY  Refills: 0     montelukast 10 mg Tablet  Commonly known as: SINGULAIR   Take 1 Tablet (10 mg total) by mouth Once a Sami  Refills: 0     olmesartan 20 mg Tablet  Commonly known as: BENICAR   20 mg, Oral, DAILY, as directed  Refills: 0     Ozempic 1 mg/dose (4 mg/3 mL) Pen Injector  Generic drug: semaglutide   1 mg, Subcutaneous, EVERY 7 DAYS  Refills: 0     promethazine 6.25 mg/5 mL Syrup  Commonly known as: PHENERGAN   6.25 mg, Oral, EVERY 6 HOURS PRN  Qty: 200 mL  Refills: 0     Ventolin HFA 90 mcg/actuation oral inhaler  Generic drug: albuterol sulfate   1-2 Puffs, Inhalation, EVERY 6 HOURS PRN  Refills: 0              DISCHARGE INSTRUCTIONS:  Post-Discharge Follow Up Appointments       Follow up with [name], MD in 2 week(s)    Phone: [phone]    Where: [facility]      Thursday May 14, 2023    Injection with Karma Greaser, PA-C at  2:00 PM       Orthopedics, [facility], [place]  [address]  [place] [address]  [phone]          No discharge procedures on file.           CONDITION ONDISCHARGE:  A. Ambulation: Full ambulation  B. Self-care Ability: Complete  C. Cognitive Status Alert  D. DNR status at discharge: Full Code    Advance Directive Information      Flowsheet Row Most Recent Value   Does the Patient have an Advance Directive? No, Information Offered and Refused            DISCHARGE DISPOSITION:  Staple              [name], MD      Copies sent to Care Team         Relationship Specialty Notifications Start End  Welton Flakes, DO PCP - General INTERNAL MEDICINE  05/15/23     Phone: [phone] Fax: [phone]         [address] [place] [address]            Referring providerscan utilize [email or web address] to access their referred [facility] patient's information.

## 2023-08-12 ENCOUNTER — Other Ambulatory Visit: Payer: BC Managed Care – PPO | Attending: ORTHOPAEDIC SURGERY

## 2023-08-12 ENCOUNTER — Other Ambulatory Visit: Payer: Self-pay

## 2023-08-12 ENCOUNTER — Ambulatory Visit (INDEPENDENT_AMBULATORY_CARE_PROVIDER_SITE_OTHER): Payer: BC Managed Care – PPO | Admitting: ORTHOPAEDIC SURGERY

## 2023-08-12 ENCOUNTER — Encounter (HOSPITAL_BASED_OUTPATIENT_CLINIC_OR_DEPARTMENT_OTHER): Payer: Self-pay | Admitting: ORTHOPAEDIC SURGERY

## 2023-08-12 VITALS — Ht 67.0 in | Wt 237.0 lb

## 2023-08-12 DIAGNOSIS — M25462 Effusion, left knee: Secondary | ICD-10-CM

## 2023-08-12 DIAGNOSIS — S83204D Other tear of unspecified meniscus, current injury, left knee, subsequent encounter: Secondary | ICD-10-CM

## 2023-08-12 NOTE — Procedures (Signed)
Procedure note:  Left knee:  Procedure consent was obtained from the patient verifying the side and location of the injection.  The knee was prepped with alcohol and Betadine and draped for sterile injection. .  Once anesthetized with 2% plain lidocaine on the lateral side knee with a 26 gauge needle an 18 gauge needle was introduced an aspiration of the knee was used to withdrawal approximate 35 cc of serosanguineous fluid.  No intra-articular injection was done.  The needle was withdrawn.  Patient tolerated procedure well the wound was cleaned dressed and patient allowed to leave

## 2023-08-12 NOTE — Progress Notes (Signed)
[address]  [place] [address]     New Patient Note    Name: [name] MRN:  [id number]   Date: 08/12/2023 DOB:  1961-08-12 (61 y.o.)         Chief Complaint:   Chief Complaint   Patient presents with    Post Op     Post op lt knee scope  DOS 08/06/23       Subjective     [name] is a 62 y.o. male presenting with patient is 6 days out status post knee scope said it was doing really pretty well until 1 or 2 days ago started having increasing pain presents today with difficulty with ambulation and mobility of the knee.  Patient denies any fever chills or injury.    Medical History:  I have reviewed and updated as appropriate the past medical, family and social history today:  Past Medical History:   Diagnosis Date    Chronic obstructive airway disease (CMS HCC)     CPAP (continuous positive airway pressure) dependence     Diabetes mellitus (CMS HCC)     Essential hypertension     Personal history of colonic polyps     Sleep apnea     Tear of meniscus of left knee     Type 2 diabetes mellitus (CMS HCC)     Wears glasses      Past Surgical History:   Procedure Laterality Date    CATARACT EXTRACTION W/ INTRAOCULAR LENS IMPLANT Bilateral     COLONOSCOPY      HX CARPAL TUNNEL RELEASE      HX HEART CATHETERIZATION      HX SHOULDER ARTHROSCOPY Left     SHOULDER SURGERY       Family Medical History:       Problem Relation (Age of Onset)    No Known Problems Mother, Father            Allergies:  Allergies   Allergen Reactions    Amoxicillin-Pot Clavulanate  Other Adverse Reaction (Add comment)    Vancomycin  Other Adverse Reaction (Add comment)       Medications:  Current Outpatient Medications   Medication Sig    aspirin (ECOTRIN) 81 mg Oral Tablet, Delayed Release (E.C.) Take 1 Tablet (81 mg total) by mouth Once a Gundry    atorvastatin (LIPITOR) 40 mg Oral Tablet Take 1 Tablet (40 mg total) by mouth Once a Guiles    hydroCHLOROthiazide (HYDRODIURIL) 12.5 mg Oral Tablet  Take 1 Tablet (12.5 mg total) by mouth Once a Elzey    metFORMIN (GLUCOPHAGE XR) 500 mg Oral Tablet Sustained Release 24 hr Take 1 Tablet (500 mg total) by mouth Once a Shifrin    montelukast (SINGULAIR) 10 mg Oral Tablet Take 1 Tablet (10 mg total) by mouth Once a Hanf    olmesartan (BENICAR) 20 mg Oral Tablet Take 1 Tablet (20 mg total) by mouth Once a Vandyne as directed    OZEMPIC 1 mg/dose (4 mg/3 mL) Subcutaneous Pen Injector Inject 1 mg under the skin Every 7 days    VENTOLIN HFA 90 mcg/actuation Inhalation oral inhaler Take 1-2 Puffs by inhalation Every 6 hours as needed       Problem List:  Patient Active Problem List    Diagnosis    Osteoarthritis of patellofemoral joint    Tear of meniscus of left knee    Type 2 diabetes mellitus (CMS HCC)  Nicotine dependence    Essential hypertension    Chronic cough    Allergic rhinitis       Review of Systems:  Constitutional: The patient denied pain, obesity, recent illness, chills, diaphoresis, fatigue, fever and malaise.  Cardiovascular: The patient denied arrhythmia, chest pain/pressure, dyspnea and edema.  Respiratory: The patient denied asthma, chest congestion, chest tightness, shortness of breath and cough.  Gastrointestinal: The patient denied gastroesophageal reflux, rectal bleeding, rectal pain, weight loss, perianal pain, abdominal pain, perirectal issues, anorexia, constipation, diarrhea, gas and bloating, nausea and vomiting.  Genitourinary/Nephrology: The patient denied burning urination, difficulty urinating and urinating at night.  Musculoskeletal: The patient denied fall in last 6 months, leg swelling, pain while walking and back pain.  Dermatologic: The patient denied rash.  Neurologic: The patient denied rash.  Neurologic: The denied light headedness, syncope and weakness.  Endocrine: The patient denied thyroid disorder, diabetes mellitus type 1 and diabetes mellitus type 2.  Hematologic/Lymphatic: The patient denied anemia and DVT.      Objective      Vitals:  Ht 1.702 m (5\' 7" )   Wt 108 kg (237 lb)   BMI 37.12 kg/m       Wt Readings from Last 5 Encounters:   08/12/23 108 kg (237 lb)   08/06/23 108 kg (237 lb)   07/20/23 108 kg (237 lb)   07/03/23 108 kg (238 lb)   05/15/23 111 kg (244 lb)       Physical Exam:  Patient walks with fully weight bearing with an antalgic gait.  Patient's range of motion is 0-100 degrees without pain but more difficult after the  1+ effusion      I have reviewed all available imaging and laboratory studies as appropriate.  Labs:  Admission on 08/06/2023, Discharged on 08/06/2023   Component Date Value Ref Range Status    GLUCOSE, POC 08/06/2023 98  70 - 100 mg/dl Final    GLUCOSE, POC 69/62/9528 106 (H)  70 - 100 mg/dl Final   Hospital Outpatient Visit on 07/20/2023   Component Date Value Ref Range Status    SODIUM 07/20/2023 139  133 - 144 mmol/L Final    POTASSIUM 07/20/2023 4.3  3.2 - 5.0 mmol/L Final    CHLORIDE 07/20/2023 104  96 - 106 mmol/L Final    CO2 TOTAL 07/20/2023 25  22 - 30 mmol/L Final    ANION GAP 07/20/2023 10  mmol/L Final    CALCIUM 07/20/2023 8.8  8.3 - 10.7 mg/dL Final    GLUCOSE 41/32/4401 108  74 - 109 mg/dL Final    BUN 02/72/5366 10  8 - 23 mg/dL Final    CREATININE 44/02/4741 1.03  0.70 - 1.20 mg/dL Final    BUN/CREA RATIO 07/20/2023 10   Final    ESTIMATED GFR 07/20/2023 82 (L)  >90 mL/min/1.69m^2 Final    **ESTIMATED GFR BY CKD-EPI (2021) EQUATION**    Kidney damage      Description         Estimated GFR  Stage    1               Normal or miniminal        90+                  kidney damage with                  normal GFR    2  Mild decrease in GFR       60-89    3               Moderate decrease in       30-59                  GFR    4               Severe decrease in GFR     15-29    5               Kidney failure             <15    Source: [employer] 2022    WBC 07/20/2023 8.3  3.7 - 11.0 x10^3/uL Final    RBC 07/20/2023 4.30 (L)  4.50 - 6.10 x10^6/uL Final    HGB  07/20/2023 12.6 (L)  13.4 - 17.5 g/dL Final    HCT 57/84/6962 38.7 (L)  38.9 - 52.0 % Final    MCV 07/20/2023 90.0  78.0 - 100.0 fL Final    MCH 07/20/2023 29.3  26.0 - 32.0 pg Final    MCHC 07/20/2023 32.6  31.0 - 35.5 g/dL Final    RDW-CV 95/28/4132 13.3  11.5 - 15.5 % Final    PLATELETS 07/20/2023 230  150 - 400 x10^3/uL Final    MPV 07/20/2023 12.1  8.7 - 12.5 fL Final    NEUTROPHIL % 07/20/2023 57.0  % Final    LYMPHOCYTE % 07/20/2023 34.2  % Final    MONOCYTE % 07/20/2023 6.9  % Final    EOSINOPHIL % 07/20/2023 1.3  % Final    BASOPHIL % 07/20/2023 0.6  % Final    NEUTROPHIL # 07/20/2023 4.71  1.50 - 7.70 x10^3/uL Final    LYMPHOCYTE # 07/20/2023 2.83  1.00 - 4.80 x10^3/uL Final    MONOCYTE # 07/20/2023 0.57  0.20 - 1.10 x10^3/uL Final    EOSINOPHIL # 07/20/2023 0.11  <=0.50 x10^3/uL Final    BASOPHIL # 07/20/2023 <0.10  <=0.20 x10^3/uL Final    IMMATURE GRANULOCYTE % 07/20/2023 0.0  0.0 - 1.0 % Final    The immature granulocyte fraction (IGF) quantifies total circulating myelocytes, metamyelocytes, and promyelocytes. It is used to evaluate immune responses to infection, inflammation, or other stimuli of the bone marrow. Caution is advised in interpreting test results in neonates who normally have greater numbers of circulating immature blood cells.      IMMATURE GRANULOCYTE # 07/20/2023 <0.10  <0.10 x10^3/uL Final       Imaging:  0    Assessment     Diagnosis:    ICD-10-CM    1. Effusion, left knee  M25.462 20610 - ARTHROCENTESIS ASPIRATION/INJ, MAJOR JOINT/BURSA (SHOULDER/HIP/KNEE/SUBACROMIAL BURSA); WO [place] (AMB ONLY-PD)      2. Peripheral tear of meniscus of left knee as current injury, unspecified meniscus, subsequent encounter  S83.204D         Encounter Medications and Orders:  Orders Placed This Encounter    20610 - ARTHROCENTESIS ASPIRATION/INJ, MAJOR JOINT/BURSA (SHOULDER/HIP/KNEE/SUBACROMIAL BURSA); WO [place] (AMB ONLY-PD)       Plan     Patient this point has difficulty with mobility and thus we are  going to try to prove that he does not h..ave an infection.  We went in scrubbed in aspirated his knee.    The fluid that was removed the bloody fluid this postoperative did not seem particularly cloudy.  This  was sent for culture and sensitivity.    Patient will be seen in 2 days if his symptoms worsen or 1 week if it does not or if his cultures were negative the we will follow through the office  No follow-ups on file.    [name], MD

## 2023-08-15 LAB — WOUND, SUPERFICIAL/NON-STERILE SITE, AEROBIC CULTURE AND GRAM STAIN
GRAM STAIN: NONE SEEN
WOUND CULTURE: NO GROWTH

## 2023-08-17 LAB — ANAEROBIC CULTURE

## 2023-08-20 ENCOUNTER — Other Ambulatory Visit: Payer: Self-pay

## 2023-08-20 ENCOUNTER — Encounter (INDEPENDENT_AMBULATORY_CARE_PROVIDER_SITE_OTHER): Payer: Self-pay | Admitting: PHYSICIAN ASSISTANT

## 2023-08-20 ENCOUNTER — Ambulatory Visit: Payer: BC Managed Care – PPO | Attending: PHYSICIAN ASSISTANT | Admitting: PHYSICIAN ASSISTANT

## 2023-08-20 VITALS — Ht 67.0 in | Wt 237.0 lb

## 2023-08-20 DIAGNOSIS — Z9889 Other specified postprocedural states: Secondary | ICD-10-CM

## 2023-08-20 DIAGNOSIS — Z4789 Encounter for other orthopedic aftercare: Secondary | ICD-10-CM

## 2023-08-20 NOTE — Progress Notes (Signed)
ORTHOPEDICS, [facility]  [address]  [place] [address]  Operated by [facility]  Progress Note    Name: [name] MRN:  [id number]   Date: 08/20/2023 DOB:  [birth year] (62 y.o.)           Chief Complaint: Post Op (DOS: 08/06/23 Left knee scope.  Patient states he is doing better than at LOV.  )    Subjective:   Scopic debridement left knee.    Patient is doing much better at this point.    He was aspirated and cultured on last visit and the cultures came back is no growth.    Pain has significantly improved.    He is doing better with his activities.    Still has difficulty with steps but it is improving.      Objective :  Ht 1.702 m (5\' 7" )   Wt 108 kg (237 lb)   BMI 37.12 kg/m       His portal incisions are well healed.    He has some mild swelling still present.    He extends to 0 flexes to 115 today in the office.    Good quad strength and function with good straight leg raise.    Ambulates independently with normal heel-toe progression.    Neurovascular is intact.           Data reviewed:    Current Outpatient Medications   Medication Sig    aspirin (ECOTRIN) 81 mg Oral Tablet, Delayed Release (E.C.) Take 1 Tablet (81 mg total) by mouth Once a Horan    atorvastatin (LIPITOR) 40 mg Oral Tablet Take 1 Tablet (40 mg total) by mouth Once a Engert    hydroCHLOROthiazide (HYDRODIURIL) 12.5 mg Oral Tablet Take 1 Tablet (12.5 mg total) by mouth Once a Speirs    metFORMIN (GLUCOPHAGE XR) 500 mg Oral Tablet Sustained Release 24 hr Take 1 Tablet (500 mg total) by mouth Once a Filkins    montelukast (SINGULAIR) 10 mg Oral Tablet Take 1 Tablet (10 mg total) by mouth Once a Hallinan    olmesartan (BENICAR) 20 mg Oral Tablet Take 1 Tablet (20 mg total) by mouth Once a Stang as directed    OZEMPIC 1 mg/dose (4 mg/3 mL) Subcutaneous Pen Injector Inject 1 mg under the skin Every 7 days    VENTOLIN HFA 90 mcg/actuation Inhalation oral inhaler Take 1-2 Puffs by inhalation Every 6 hours as  needed       Assessment/Plan  Problem List Items Addressed This Visit    None    At this point he is going to continue with his home exercise program.    Continue with activity progression as tolerated.    We are going to see him back here in 3 weeks and re-evaluate at that time.    He will call us if he needs [place] sooner.    He was offered physical therapy today the wants to wait and see how he does over the next couple of weeks.    Karma Greaser, PA-C

## 2023-09-03 ENCOUNTER — Encounter (INDEPENDENT_AMBULATORY_CARE_PROVIDER_SITE_OTHER): Payer: Self-pay | Admitting: NURSE PRACTITIONER

## 2023-09-07 ENCOUNTER — Ambulatory Visit (INDEPENDENT_AMBULATORY_CARE_PROVIDER_SITE_OTHER): Payer: BC Managed Care – PPO | Admitting: NURSE PRACTITIONER

## 2023-09-16 ENCOUNTER — Ambulatory Visit (HOSPITAL_BASED_OUTPATIENT_CLINIC_OR_DEPARTMENT_OTHER): Payer: BC Managed Care – PPO | Admitting: NURSE PRACTITIONER

## 2023-09-16 ENCOUNTER — Other Ambulatory Visit: Payer: Self-pay

## 2023-09-16 ENCOUNTER — Encounter (INDEPENDENT_AMBULATORY_CARE_PROVIDER_SITE_OTHER): Payer: Self-pay | Admitting: PHYSICIAN ASSISTANT

## 2023-09-16 ENCOUNTER — Telehealth (INDEPENDENT_AMBULATORY_CARE_PROVIDER_SITE_OTHER): Payer: Self-pay | Admitting: NURSE PRACTITIONER

## 2023-09-16 ENCOUNTER — Ambulatory Visit: Payer: BC Managed Care – PPO | Attending: PHYSICIAN ASSISTANT | Admitting: PHYSICIAN ASSISTANT

## 2023-09-16 ENCOUNTER — Encounter (INDEPENDENT_AMBULATORY_CARE_PROVIDER_SITE_OTHER): Payer: Self-pay | Admitting: NURSE PRACTITIONER

## 2023-09-16 VITALS — BP 118/76 | HR 61 | Ht 67.0 in | Wt 246.0 lb

## 2023-09-16 VITALS — Ht 67.0 in | Wt 239.0 lb

## 2023-09-16 DIAGNOSIS — Z7984 Long term (current) use of oral hypoglycemic drugs: Secondary | ICD-10-CM

## 2023-09-16 DIAGNOSIS — I1310 Hypertensive heart and chronic kidney disease without heart failure, with stage 1 through stage 4 chronic kidney disease, or unspecified chronic kidney disease: Secondary | ICD-10-CM | POA: Insufficient documentation

## 2023-09-16 DIAGNOSIS — N182 Chronic kidney disease, stage 2 (mild): Secondary | ICD-10-CM

## 2023-09-16 DIAGNOSIS — E559 Vitamin D deficiency, unspecified: Secondary | ICD-10-CM

## 2023-09-16 DIAGNOSIS — E1122 Type 2 diabetes mellitus with diabetic chronic kidney disease: Secondary | ICD-10-CM

## 2023-09-16 DIAGNOSIS — E119 Type 2 diabetes mellitus without complications: Secondary | ICD-10-CM

## 2023-09-16 DIAGNOSIS — Z4789 Encounter for other orthopedic aftercare: Secondary | ICD-10-CM

## 2023-09-16 DIAGNOSIS — Z9889 Other specified postprocedural states: Secondary | ICD-10-CM | POA: Insufficient documentation

## 2023-09-16 NOTE — Progress Notes (Signed)
ORTHOPEDICS, [facility]  [address]  [place] [address]  Operated by [facility]  Progress Note    Name: [name] MRN:  [id number]   Date: 09/16/2023 DOB:  October 25, 1961 (62 y.o.)           Chief Complaint: Post Op (F/up s/p Lt Knee Scope & doing well. Some tenderness at times.)    Subjective:   Patient is 6 week follow-up arthroscopic meniscectomy left knee.    Patient is doing well overall at this point with no major complications.    He continues to progress his exercises.    Has been doing his home exercise program.    Denies catching locking or giving way sensations.    Much better than what it was prior to surgery.      Objective :  Ht 1.702 m (5\' 7" )   Wt 108 kg (239 lb)   BMI 37.43 kg/m       Patient's portal incisions are well healed without breakdown erythema or drainage.    He was excellent range motion flexion-extension with no deficits noted.    Good quad function.    Good stability is noted.    No pitting edema.    Good distal pulses       Data reviewed:    Current Outpatient Medications   Medication Sig    aspirin (ECOTRIN) 81 mg Oral Tablet, Delayed Release (E.C.) Take 1 Tablet (81 mg total) by mouth Once a Paul    atorvastatin (LIPITOR) 40 mg Oral Tablet Take 1 Tablet (40 mg total) by mouth Once a Cirelli    hydroCHLOROthiazide (HYDRODIURIL) 12.5 mg Oral Tablet Take 1 Tablet (12.5 mg total) by mouth Once a Rowton    metFORMIN (GLUCOPHAGE XR) 500 mg Oral Tablet Sustained Release 24 hr Take 1 Tablet (500 mg total) by mouth Once a Abila    metoprolol succinate (TOPROL-XL) 25 mg Oral Tablet Sustained Release 24 hr Take 1 Tablet (25 mg total) by mouth Once a Spike    montelukast (SINGULAIR) 10 mg Oral Tablet Take 1 Tablet (10 mg total) by mouth Once a Bhattacharyya    olmesartan (BENICAR) 20 mg Oral Tablet Take 1 Tablet (20 mg total) by mouth Once a Capozzi as directed    OZEMPIC 1 mg/dose (4 mg/3 mL) Subcutaneous Pen Injector Inject 1 mg under the skin Every 7 days     VENTOLIN HFA 90 mcg/actuation Inhalation oral inhaler Take 1-2 Puffs by inhalation Every 6 hours as needed       Assessment/Plan  Problem List Items Addressed This Visit    None    At this point we will go ahead and release him today.    He is going to continue with his activities as tolerated.    He was offered formal physical therapy but has declined at this point.    We will continue with his progression of activities and we will see him back here as needed in the future.    Karma Greaser, PA-C

## 2023-09-16 NOTE — Progress Notes (Signed)
NEPHROLOGY, [facility]  [address]  [place] [address]  Operated by [facility]  Progress Note    Encounter Date: 09/16/2023    Patient ID:  [name]  YTK:[id number]    DOB: Oct 08, 1961  Age: 62 y.o. male  Subjective   Subjective:     Chief Complaint   Patient presents with    Annual Exam     Here today for annual follow up   He has been doing ok   BP and DM controlled mostly   Does admit to occasional high BP readings when in pain   Had recent left knee surgery and has had residual pain as a result   Currently he is tolerating the pain with no need for PT at this time   Avoiding NSAIDs     The history is provided by the patient.     Current Outpatient Medications   Medication Sig    aspirin (ECOTRIN) 81 mg Oral Tablet, Delayed Release (E.C.) Take 1 Tablet (81 mg total) by mouth Once a Buffkin    atorvastatin (LIPITOR) 40 mg Oral Tablet Take 1 Tablet (40 mg total) by mouth Once a Castellanos    hydroCHLOROthiazide (HYDRODIURIL) 12.5 mg Oral Tablet Take 1 Tablet (12.5 mg total) by mouth Once a Funchess    metFORMIN (GLUCOPHAGE XR) 500 mg Oral Tablet Sustained Release 24 hr Take 1 Tablet (500 mg total) by mouth Once a Trimm    metoprolol succinate (TOPROL-XL) 25 mg Oral Tablet Sustained Release 24 hr Take 1 Tablet (25 mg total) by mouth Once a Pena    montelukast (SINGULAIR) 10 mg Oral Tablet Take 1 Tablet (10 mg total) by mouth Once a Mottern    olmesartan (BENICAR) 20 mg Oral Tablet Take 1 Tablet (20 mg total) by mouth Once a Dahan as directed    OZEMPIC 1 mg/dose (4 mg/3 mL) Subcutaneous Pen Injector Inject 1 mg under the skin Every 7 days    VENTOLIN HFA 90 mcg/actuation Inhalation oral inhaler Take 1-2 Puffs by inhalation Every 6 hours as needed     Allergies   Allergen Reactions    Amoxicillin-Pot Clavulanate  Other Adverse Reaction (Add comment)    Vancomycin  Other Adverse Reaction (Add comment)     Past Medical History:   Diagnosis Date    Chronic obstructive airway disease (CMS HCC)      CPAP (continuous positive airway pressure) dependence     Diabetes mellitus (CMS HCC)     Essential hypertension     Personal history of colonic polyps     Sleep apnea     Tear of meniscus of left knee     Type 2 diabetes mellitus (CMS HCC)     Wears glasses          Past Surgical History:   Procedure Laterality Date    CATARACT EXTRACTION W/ INTRAOCULAR LENS IMPLANT Bilateral     COLONOSCOPY      HX CARPAL TUNNEL RELEASE      HX HEART CATHETERIZATION      HX SHOULDER ARTHROSCOPY Left     SHOULDER SURGERY           Family Medical History:       Problem Relation (Age of Onset)    No Known Problems Mother, Father            Social History     Tobacco Use    Smoking status: Former  Current packs/Raz: 1.00     Average packs/Curtice: 1 pack/[place] for 29.7 years (29.7 ttl pk-yrs)     Types: Cigarettes     Start date: 1995    Smokeless tobacco: Never   Vaping Use    Vaping status: Never Used   Substance Use Topics    Alcohol use: Not Currently    Drug use: Never       Review of Systems   Constitutional:  Negative for activity change, appetite change, chills, diaphoresis, fatigue and fever.   HENT:  Negative for congestion and sore throat.    Respiratory:  Negative for cough, shortness of breath and wheezing.    Cardiovascular:  Negative for chest pain, palpitations and leg swelling.   Gastrointestinal:  Negative for abdominal pain, constipation, diarrhea, nausea and vomiting.   Genitourinary:  Negative for difficulty urinating, dysuria, flank pain, frequency, hematuria and urgency.   Musculoskeletal:  Positive for arthralgias. Negative for back pain and joint swelling.   Skin:  Negative for color change, rash and wound.   Neurological:  Negative for dizziness, syncope, weakness and headaches.   Psychiatric/Behavioral:  Negative for confusion. The patient is not nervous/anxious.         Objective   Objective:   Vitals: BP 118/76   Pulse 61   Ht 1.702 m (5\' 7" )   Wt 112 kg (246 lb)   SpO2 96%   BMI 38.53 kg/m          Physical Exam  Constitutional:       General: He is not in acute distress.     Appearance: Normal appearance. He is normal weight.   HENT:      Head: Normocephalic.   Cardiovascular:      Rate and Rhythm: Normal rate and regular rhythm.      Heart sounds: No murmur heard.  Pulmonary:      Effort: Pulmonary effort is normal. No respiratory distress.      Breath sounds: Normal breath sounds. No wheezing.   Abdominal:      General: Abdomen is flat. Bowel sounds are normal.      Palpations: Abdomen is soft.      Tenderness: There is no abdominal tenderness.   Musculoskeletal:         General: No swelling. Normal range of motion.      Cervical back: Normal range of motion.   Skin:     General: Skin is warm and dry.      Findings: No rash.   Neurological:      Mental Status: He is alert.   Psychiatric:         Mood and Affect: Mood normal.         Behavior: Behavior normal.         Thought Content: Thought content normal.         Judgment: Judgment normal.     09/14/23  Hgb 12.8  UPCR 45 mg  PTH 118  Vit d pending       RENALFUNC  SODIUM   Date Value Ref Range Status   07/20/2023 139 133 - 144 mmol/L Final     POTASSIUM   Date Value Ref Range Status   07/20/2023 4.3 3.2 - 5.0 mmol/L Final     CHLORIDE   Date Value Ref Range Status   07/20/2023 104 96 - 106 mmol/L Final     CO2 TOTAL   Date Value Ref Range Status   07/20/2023 25 22 -  30 mmol/L Final     BUN   Date Value Ref Range Status   07/20/2023 10 8 - 23 mg/dL Final     CREATININE   Date Value Ref Range Status   07/20/2023 1.03 0.70 - 1.20 mg/dL Final     BUN/CREA RATIO   Date Value Ref Range Status   07/20/2023 10  Final     ESTIMATED GFR   Date Value Ref Range Status   07/20/2023 82 (L) >90 mL/min/1.55m^2 Final     Comment:     **ESTIMATED GFR BY CKD-EPI (2021) EQUATION**    Kidney damage      Description         Estimated GFR  Stage    1               Normal or miniminal        90+                  kidney damage with                  normal GFR    2                Mild decrease in GFR       60-89    3               Moderate decrease in       30-59                  GFR    4               Severe decrease in GFR     15-29    5               Kidney failure             <15    Source: [employer] 2022     CALCIUM   Date Value Ref Range Status   07/20/2023 8.8 8.3 - 10.7 mg/dL Final          Assessment & Plan:     ENCOUNTER DIAGNOSES     ICD-10-CM   1. 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 Type 2 diabetes mellitus (CMS HCC)  E11.9   4. Vitamin D deficiency  E55.9   Stable, preserved renal function   Proteinuria controlled   On arb and tolerating   Farxiga once again discussed, reviewed in detail last year as well   With proteinuria controlled/ at goal, defer for now, willing to consider should levels worsen   PTH is elevated, likely needs vit d but will wait on level and contact when available   No other changes made today   Call with any issues in the interm     Risk factors for progression of CKD were reviewed in detail with the patient. Patient was advised as follows:   - to maintain a goal BP < 130/80   - maintain a goal A1c < 7.0%, ideally 6.5% or below   - continue renal protective ACE/ARB, unless contraindicated   - continue SGLT-2 inhibitor, unless contraindicated   - avoid nephrotoxic agents including NSAIDs and IV contrast (unless emergent or our office is contacted and nephrology recommendations are provided to help minimize  the risk of CIN)   - maintain controlled cholesterol with goal total < 200, LDL< 100 and continue statin if indicated   - life style modifications such as a health diet and physical activity recommended      Orders Placed This Encounter    CBC/DIFF    PARATHYROID HORMONE (PTH)    VITAMIN D 25 TOTAL    RENAL FUNCTION PANEL    PROTEIN/CREATININE RATIO, URINE, RANDOM    URINALYSIS WITH REFLEX  MICROSCOPIC AND CULTURE IF POSITIVE       Return in about 1 year (around 09/15/2024).    Moshe Salisbury, APRN,FNP-BC    I independently of the faculty provider spent a total of (10) minutes in direct/indirect care of this patient including initial evaluation, review of laboratory, radiology, diagnostic studies, review of medical record, order entry and coordination of care.

## 2023-09-17 NOTE — Telephone Encounter (Signed)
Not back yet.

## 2023-09-18 NOTE — Telephone Encounter (Signed)
printed

## 2023-09-18 NOTE — Telephone Encounter (Signed)
Advised patient

## 2023-11-03 ENCOUNTER — Ambulatory Visit (INDEPENDENT_AMBULATORY_CARE_PROVIDER_SITE_OTHER): Payer: Self-pay | Admitting: INTERNAL MEDICINE

## 2023-12-15 ENCOUNTER — Telehealth (INDEPENDENT_AMBULATORY_CARE_PROVIDER_SITE_OTHER): Payer: Self-pay | Admitting: PULMONARY DISEASE

## 2023-12-15 DIAGNOSIS — Z720 Tobacco use: Secondary | ICD-10-CM

## 2023-12-15 NOTE — Telephone Encounter (Signed)
The patient called in about his LDCT scan. He is wanting to get this scheduled. Per jeremy okay to place the order and to cancel and r.s his appt until after he has had his CT Scan. I called the pt and let him know of this.

## 2023-12-31 ENCOUNTER — Ambulatory Visit (INDEPENDENT_AMBULATORY_CARE_PROVIDER_SITE_OTHER): Payer: Self-pay | Admitting: PHYSICIAN ASSISTANT

## 2024-05-03 ENCOUNTER — Encounter (INDEPENDENT_AMBULATORY_CARE_PROVIDER_SITE_OTHER): Payer: Self-pay | Admitting: INTERNAL MEDICINE

## 2024-05-03 ENCOUNTER — Other Ambulatory Visit (INDEPENDENT_AMBULATORY_CARE_PROVIDER_SITE_OTHER): Payer: Self-pay | Admitting: INTERNAL MEDICINE

## 2024-08-17 ENCOUNTER — Telehealth (INDEPENDENT_AMBULATORY_CARE_PROVIDER_SITE_OTHER): Payer: Self-pay | Admitting: INTERNAL MEDICINE

## 2024-08-17 NOTE — Telephone Encounter (Signed)
 Angie sent e-mail asking us  to call patient and ask if he would be returning to [facility] as a patient of Dr. Homer.  I called, spoke to patient.  He stated he has found another PCP and will not be returning to [facility] due to his insurance coverage.  Transfer form sent on 08/17/24.

## 2024-08-25 ENCOUNTER — Encounter (INDEPENDENT_AMBULATORY_CARE_PROVIDER_SITE_OTHER): Payer: Self-pay | Admitting: INTERNAL MEDICINE

## 2024-09-15 ENCOUNTER — Ambulatory Visit: Payer: Self-pay | Admitting: NURSE PRACTITIONER
